# Patient Record
Sex: Male | Born: 1935 | Race: White | Hispanic: No | Marital: Married
Health system: Southern US, Community
[De-identification: ages and names within clinical notes are randomized; demographics above are authoritative.]

---

## 2004-06-16 ENCOUNTER — Ambulatory Visit: Payer: Self-pay | Admitting: Urology

## 2004-06-29 ENCOUNTER — Ambulatory Visit: Payer: Self-pay | Admitting: Cardiology

## 2004-07-13 ENCOUNTER — Ambulatory Visit: Payer: Self-pay | Admitting: Internal Medicine

## 2004-07-21 ENCOUNTER — Ambulatory Visit: Payer: Self-pay | Admitting: Internal Medicine

## 2005-06-16 ENCOUNTER — Ambulatory Visit: Payer: Self-pay | Admitting: Internal Medicine

## 2005-06-30 ENCOUNTER — Ambulatory Visit: Payer: Self-pay | Admitting: Internal Medicine

## 2006-02-22 ENCOUNTER — Ambulatory Visit: Payer: Self-pay | Admitting: Internal Medicine

## 2006-02-27 ENCOUNTER — Ambulatory Visit: Payer: Self-pay | Admitting: Internal Medicine

## 2007-03-31 ENCOUNTER — Ambulatory Visit: Payer: Self-pay | Admitting: Internal Medicine

## 2008-02-07 ENCOUNTER — Other Ambulatory Visit: Payer: Self-pay

## 2008-02-07 ENCOUNTER — Inpatient Hospital Stay: Payer: Self-pay | Admitting: Internal Medicine

## 2008-02-18 ENCOUNTER — Ambulatory Visit: Payer: Self-pay | Admitting: Internal Medicine

## 2009-10-02 ENCOUNTER — Ambulatory Visit: Payer: Self-pay | Admitting: Internal Medicine

## 2010-10-15 ENCOUNTER — Inpatient Hospital Stay: Payer: Self-pay | Admitting: Orthopedic Surgery

## 2010-10-22 ENCOUNTER — Encounter: Payer: Self-pay | Admitting: Internal Medicine

## 2010-10-29 ENCOUNTER — Encounter: Payer: Self-pay | Admitting: Internal Medicine

## 2010-11-28 ENCOUNTER — Encounter: Payer: Self-pay | Admitting: Internal Medicine

## 2010-12-29 ENCOUNTER — Encounter: Payer: Self-pay | Admitting: Internal Medicine

## 2011-04-12 ENCOUNTER — Inpatient Hospital Stay: Payer: Self-pay | Admitting: Internal Medicine

## 2011-04-18 LAB — PATHOLOGY REPORT

## 2011-05-10 ENCOUNTER — Other Ambulatory Visit: Payer: Self-pay | Admitting: Podiatry

## 2011-05-12 ENCOUNTER — Inpatient Hospital Stay: Payer: Self-pay | Admitting: Podiatry

## 2011-06-27 ENCOUNTER — Other Ambulatory Visit: Payer: Self-pay | Admitting: Podiatry

## 2011-08-01 ENCOUNTER — Other Ambulatory Visit: Payer: Self-pay | Admitting: Podiatry

## 2011-08-05 LAB — WOUND CULTURE

## 2011-11-22 ENCOUNTER — Other Ambulatory Visit: Payer: Self-pay | Admitting: Podiatry

## 2011-11-30 ENCOUNTER — Ambulatory Visit: Payer: Self-pay | Admitting: Podiatry

## 2011-11-30 LAB — BASIC METABOLIC PANEL
BUN: 29 mg/dL — ABNORMAL HIGH (ref 7–18)
Chloride: 104 mmol/L (ref 98–107)
Co2: 29 mmol/L (ref 21–32)
EGFR (Non-African Amer.): 31 — ABNORMAL LOW
Glucose: 159 mg/dL — ABNORMAL HIGH (ref 65–99)
Osmolality: 289 (ref 275–301)
Potassium: 3.9 mmol/L (ref 3.5–5.1)
Sodium: 140 mmol/L (ref 136–145)

## 2011-11-30 LAB — PROTIME-INR
INR: 2.3
Prothrombin Time: 25.4 secs — ABNORMAL HIGH (ref 11.5–14.7)

## 2011-11-30 LAB — HEMOGLOBIN: HGB: 14.9 g/dL (ref 13.0–18.0)

## 2011-12-02 ENCOUNTER — Ambulatory Visit: Payer: Self-pay | Admitting: Podiatry

## 2012-01-12 ENCOUNTER — Ambulatory Visit: Payer: Self-pay | Admitting: Podiatry

## 2012-03-07 ENCOUNTER — Inpatient Hospital Stay: Payer: Self-pay | Admitting: Surgery

## 2012-03-07 LAB — URINALYSIS, COMPLETE
Bilirubin,UR: NEGATIVE
Leukocyte Esterase: NEGATIVE
Nitrite: NEGATIVE
Ph: 5 (ref 4.5–8.0)
Protein: 100
RBC,UR: 5 /HPF (ref 0–5)
Specific Gravity: 1.02 (ref 1.003–1.030)
Squamous Epithelial: <1

## 2012-03-07 LAB — CBC
HCT: 42.6 % (ref 40.0–52.0)
HGB: 14.6 g/dL (ref 13.0–18.0)
RDW: 16.1 % — ABNORMAL HIGH (ref 11.5–14.5)
WBC: 16.1 10*3/uL — ABNORMAL HIGH (ref 3.8–10.6)

## 2012-03-07 LAB — COMPREHENSIVE METABOLIC PANEL
Albumin: 3.6 g/dL (ref 3.4–5.0)
Alkaline Phosphatase: 167 U/L — ABNORMAL HIGH (ref 50–136)
Calcium, Total: 8.4 mg/dL — ABNORMAL LOW (ref 8.5–10.1)
Co2: 24 mmol/L (ref 21–32)
EGFR (African American): 39 — ABNORMAL LOW
SGOT(AST): 245 U/L — ABNORMAL HIGH (ref 15–37)
SGPT (ALT): 128 U/L — ABNORMAL HIGH (ref 12–78)
Total Protein: 7.2 g/dL (ref 6.4–8.2)

## 2012-03-07 LAB — LIPASE, BLOOD: Lipase: 74 U/L (ref 73–393)

## 2012-03-07 LAB — TROPONIN I: Troponin-I: 0.03 ng/mL

## 2012-03-07 LAB — PROTIME-INR
INR: 3.9
Prothrombin Time: 38.1 secs — ABNORMAL HIGH (ref 11.5–14.7)

## 2012-03-08 DIAGNOSIS — I4891 Unspecified atrial fibrillation: Secondary | ICD-10-CM

## 2012-03-08 LAB — BASIC METABOLIC PANEL
BUN: 26 mg/dL — ABNORMAL HIGH (ref 7–18)
Creatinine: 1.93 mg/dL — ABNORMAL HIGH (ref 0.60–1.30)
EGFR (Non-African Amer.): 32 — ABNORMAL LOW
Glucose: 152 mg/dL — ABNORMAL HIGH (ref 65–99)
Osmolality: 289 (ref 275–301)
Potassium: 4.2 mmol/L (ref 3.5–5.1)
Sodium: 141 mmol/L (ref 136–145)

## 2012-03-08 LAB — HEPATIC FUNCTION PANEL A (ARMC)
Bilirubin, Direct: 1.2 mg/dL — ABNORMAL HIGH (ref 0.00–0.20)
Bilirubin,Total: 3.5 mg/dL — ABNORMAL HIGH (ref 0.2–1.0)
SGOT(AST): 151 U/L — ABNORMAL HIGH (ref 15–37)
Total Protein: 6.4 g/dL (ref 6.4–8.2)

## 2012-03-08 LAB — CBC WITH DIFFERENTIAL/PLATELET
Basophil #: 0 10*3/uL (ref 0.0–0.1)
Eosinophil #: 0 10*3/uL (ref 0.0–0.7)
HCT: 38.9 % — ABNORMAL LOW (ref 40.0–52.0)
Lymphocyte #: 1.1 10*3/uL (ref 1.0–3.6)
Lymphocyte %: 6 %
MCH: 30.1 pg (ref 26.0–34.0)
MCHC: 35 g/dL (ref 32.0–36.0)
MCV: 86 fL (ref 80–100)
Neutrophil #: 15.8 10*3/uL — ABNORMAL HIGH (ref 1.4–6.5)
Platelet: 111 10*3/uL — ABNORMAL LOW (ref 150–440)
RDW: 15.7 % — ABNORMAL HIGH (ref 11.5–14.5)

## 2012-03-09 LAB — CBC WITH DIFFERENTIAL/PLATELET
Basophil #: 0 10*3/uL (ref 0.0–0.1)
Basophil %: 0.3 %
Eosinophil %: 0.9 %
HGB: 12.6 g/dL — ABNORMAL LOW (ref 13.0–18.0)
Lymphocyte #: 1.2 10*3/uL (ref 1.0–3.6)
MCH: 30.6 pg (ref 26.0–34.0)
MCHC: 35.8 g/dL (ref 32.0–36.0)
MCV: 86 fL (ref 80–100)
Monocyte #: 1.1 x10 3/mm — ABNORMAL HIGH (ref 0.2–1.0)
Monocyte %: 7.5 %
Neutrophil %: 82.8 %
Platelet: 102 10*3/uL — ABNORMAL LOW (ref 150–440)
RBC: 4.11 10*6/uL — ABNORMAL LOW (ref 4.40–5.90)
WBC: 14.4 10*3/uL — ABNORMAL HIGH (ref 3.8–10.6)

## 2012-03-09 LAB — PROTIME-INR
INR: 2.9
Prothrombin Time: 30.2 secs — ABNORMAL HIGH (ref 11.5–14.7)

## 2012-03-09 LAB — BASIC METABOLIC PANEL
Anion Gap: 12 (ref 7–16)
Calcium, Total: 7.8 mg/dL — ABNORMAL LOW (ref 8.5–10.1)
Creatinine: 2.22 mg/dL — ABNORMAL HIGH (ref 0.60–1.30)
EGFR (African American): 31 — ABNORMAL LOW
Glucose: 111 mg/dL — ABNORMAL HIGH (ref 65–99)
Osmolality: 282 (ref 275–301)
Sodium: 137 mmol/L (ref 136–145)

## 2012-03-09 LAB — HEPATIC FUNCTION PANEL A (ARMC)
Albumin: 2.8 g/dL — ABNORMAL LOW (ref 3.4–5.0)
Alkaline Phosphatase: 103 U/L (ref 50–136)
SGOT(AST): 44 U/L — ABNORMAL HIGH (ref 15–37)

## 2012-03-10 LAB — CBC WITH DIFFERENTIAL/PLATELET
Basophil #: 0 10*3/uL (ref 0.0–0.1)
Basophil %: 0.5 %
Eosinophil #: 0.2 10*3/uL (ref 0.0–0.7)
HGB: 12.9 g/dL — ABNORMAL LOW (ref 13.0–18.0)
Lymphocyte %: 14.4 %
MCHC: 34.9 g/dL (ref 32.0–36.0)
Monocyte #: 0.6 x10 3/mm (ref 0.2–1.0)
Neutrophil #: 6.5 10*3/uL (ref 1.4–6.5)
Neutrophil %: 75.8 %
Platelet: 106 10*3/uL — ABNORMAL LOW (ref 150–440)
RDW: 15.8 % — ABNORMAL HIGH (ref 11.5–14.5)
WBC: 8.5 10*3/uL (ref 3.8–10.6)

## 2012-03-10 LAB — BASIC METABOLIC PANEL
Anion Gap: 10 (ref 7–16)
Calcium, Total: 7.9 mg/dL — ABNORMAL LOW (ref 8.5–10.1)
Chloride: 106 mmol/L (ref 98–107)
Co2: 24 mmol/L (ref 21–32)
Creatinine: 2.01 mg/dL — ABNORMAL HIGH (ref 0.60–1.30)
EGFR (Non-African Amer.): 30 — ABNORMAL LOW
Potassium: 3.8 mmol/L (ref 3.5–5.1)
Sodium: 140 mmol/L (ref 136–145)

## 2012-03-10 LAB — HEPATIC FUNCTION PANEL A (ARMC)
Albumin: 2.8 g/dL — ABNORMAL LOW (ref 3.4–5.0)
Bilirubin,Total: 2.7 mg/dL — ABNORMAL HIGH (ref 0.2–1.0)
SGOT(AST): 22 U/L (ref 15–37)
SGPT (ALT): 56 U/L (ref 12–78)
Total Protein: 6.5 g/dL (ref 6.4–8.2)

## 2012-03-10 LAB — PROTIME-INR: INR: 1.4

## 2012-03-11 LAB — CBC WITH DIFFERENTIAL/PLATELET
Basophil #: 0 10*3/uL (ref 0.0–0.1)
Basophil #: 0 10*3/uL (ref 0.0–0.1)
Eosinophil #: 0.1 10*3/uL (ref 0.0–0.7)
Eosinophil %: 0.4 %
HCT: 36.1 % — ABNORMAL LOW (ref 40.0–52.0)
Lymphocyte #: 1.1 10*3/uL (ref 1.0–3.6)
Lymphocyte #: 0.6 10*3/uL — ABNORMAL LOW (ref 1.0–3.6)
Lymphocyte %: 18.7 %
Lymphocyte %: 5.9 %
MCH: 29.4 pg (ref 26.0–34.0)
MCH: 30 pg (ref 26.0–34.0)
MCHC: 34.2 g/dL (ref 32.0–36.0)
MCHC: 34.3 g/dL (ref 32.0–36.0)
MCV: 86 fL (ref 80–100)
MCV: 87 fL (ref 80–100)
Monocyte #: 0.8 x10 3/mm (ref 0.2–1.0)
Monocyte %: 9.7 %
Neutrophil #: 4.2 10*3/uL (ref 1.4–6.5)
Platelet: 119 10*3/uL — ABNORMAL LOW (ref 150–440)
RDW: 15.8 % — ABNORMAL HIGH (ref 11.5–14.5)
RDW: 15.5 % — ABNORMAL HIGH (ref 11.5–14.5)

## 2012-03-11 LAB — BASIC METABOLIC PANEL
Anion Gap: 9 (ref 7–16)
BUN: 21 mg/dL — ABNORMAL HIGH (ref 7–18)
Chloride: 109 mmol/L — ABNORMAL HIGH (ref 98–107)
Co2: 23 mmol/L (ref 21–32)
Creatinine: 1.73 mg/dL — ABNORMAL HIGH (ref 0.60–1.30)
Osmolality: 285 (ref 275–301)
Potassium: 3.8 mmol/L (ref 3.5–5.1)

## 2012-03-12 LAB — CBC WITH DIFFERENTIAL/PLATELET
Basophil #: 0 10*3/uL (ref 0.0–0.1)
HCT: 36.1 % — ABNORMAL LOW (ref 40.0–52.0)
Lymphocyte #: 0.8 10*3/uL — ABNORMAL LOW (ref 1.0–3.6)
Lymphocyte %: 8 %
MCHC: 34.5 g/dL (ref 32.0–36.0)
MCV: 88 fL (ref 80–100)
Monocyte %: 9.2 %
Neutrophil %: 82.1 %
Platelet: 123 10*3/uL — ABNORMAL LOW (ref 150–440)
RDW: 15.7 % — ABNORMAL HIGH (ref 11.5–14.5)

## 2012-03-12 LAB — COMPREHENSIVE METABOLIC PANEL
Anion Gap: 9 (ref 7–16)
BUN: 20 mg/dL — ABNORMAL HIGH (ref 7–18)
Chloride: 105 mmol/L (ref 98–107)
Co2: 24 mmol/L (ref 21–32)
Creatinine: 1.87 mg/dL — ABNORMAL HIGH (ref 0.60–1.30)
Osmolality: 283 (ref 275–301)
Potassium: 4.1 mmol/L (ref 3.5–5.1)
SGPT (ALT): 37 U/L (ref 12–78)
Total Protein: 6.3 g/dL — ABNORMAL LOW (ref 6.4–8.2)

## 2012-03-12 LAB — PROTIME-INR
INR: 1
Prothrombin Time: 14 secs (ref 11.5–14.7)

## 2012-03-13 LAB — COMPREHENSIVE METABOLIC PANEL
Anion Gap: 9 (ref 7–16)
Calcium, Total: 7.9 mg/dL — ABNORMAL LOW (ref 8.5–10.1)
Co2: 23 mmol/L (ref 21–32)
Creatinine: 1.84 mg/dL — ABNORMAL HIGH (ref 0.60–1.30)
EGFR (African American): 39 — ABNORMAL LOW
Glucose: 151 mg/dL — ABNORMAL HIGH (ref 65–99)
Osmolality: 280 (ref 275–301)
SGOT(AST): 14 U/L — ABNORMAL LOW (ref 15–37)
SGPT (ALT): 22 U/L (ref 12–78)
Total Protein: 6.1 g/dL — ABNORMAL LOW (ref 6.4–8.2)

## 2012-03-13 LAB — PROTIME-INR: Prothrombin Time: 15 secs — ABNORMAL HIGH (ref 11.5–14.7)

## 2012-03-14 LAB — HEPATIC FUNCTION PANEL A (ARMC)
Bilirubin,Total: 2 mg/dL — ABNORMAL HIGH (ref 0.2–1.0)
SGOT(AST): 11 U/L — ABNORMAL LOW (ref 15–37)
SGPT (ALT): 18 U/L (ref 12–78)

## 2012-03-14 LAB — BASIC METABOLIC PANEL
Anion Gap: 9 (ref 7–16)
BUN: 17 mg/dL (ref 7–18)
Chloride: 105 mmol/L (ref 98–107)
Co2: 24 mmol/L (ref 21–32)
EGFR (African American): 46 — ABNORMAL LOW
EGFR (Non-African Amer.): 40 — ABNORMAL LOW
Glucose: 144 mg/dL — ABNORMAL HIGH (ref 65–99)
Osmolality: 280 (ref 275–301)
Sodium: 138 mmol/L (ref 136–145)

## 2012-03-14 LAB — PATHOLOGY REPORT

## 2012-03-15 LAB — CBC WITH DIFFERENTIAL/PLATELET
Basophil %: 0.6 %
Eosinophil #: 0.2 10*3/uL (ref 0.0–0.7)
Eosinophil %: 2.8 %
Lymphocyte #: 0.8 10*3/uL — ABNORMAL LOW (ref 1.0–3.6)
Lymphocyte %: 15 %
MCHC: 34.2 g/dL (ref 32.0–36.0)
Monocyte #: 0.5 x10 3/mm (ref 0.2–1.0)
Neutrophil #: 4 10*3/uL (ref 1.4–6.5)
Platelet: 156 10*3/uL (ref 150–440)
RDW: 15.5 % — ABNORMAL HIGH (ref 11.5–14.5)
WBC: 5.6 10*3/uL (ref 3.8–10.6)

## 2012-03-15 LAB — COMPREHENSIVE METABOLIC PANEL
Bilirubin,Total: 1.8 mg/dL — ABNORMAL HIGH (ref 0.2–1.0)
Calcium, Total: 8.3 mg/dL — ABNORMAL LOW (ref 8.5–10.1)
Chloride: 106 mmol/L (ref 98–107)
Co2: 25 mmol/L (ref 21–32)
Creatinine: 1.62 mg/dL — ABNORMAL HIGH (ref 0.60–1.30)
EGFR (African American): 46 — ABNORMAL LOW
EGFR (Non-African Amer.): 39 — ABNORMAL LOW
Glucose: 159 mg/dL — ABNORMAL HIGH (ref 65–99)
Osmolality: 282 (ref 275–301)
Potassium: 3.8 mmol/L (ref 3.5–5.1)
SGPT (ALT): 16 U/L (ref 12–78)
Sodium: 139 mmol/L (ref 136–145)

## 2012-03-16 ENCOUNTER — Encounter: Payer: Self-pay | Admitting: Internal Medicine

## 2012-03-20 LAB — PROTIME-INR
INR: 1.2
Prothrombin Time: 15.9 secs — ABNORMAL HIGH (ref 11.5–14.7)

## 2012-03-22 LAB — PROTIME-INR: Prothrombin Time: 17.5 secs — ABNORMAL HIGH (ref 11.5–14.7)

## 2012-03-27 LAB — PROTIME-INR: INR: 1.6

## 2013-01-08 ENCOUNTER — Emergency Department: Payer: Self-pay | Admitting: Emergency Medicine

## 2013-05-29 ENCOUNTER — Emergency Department: Payer: Self-pay | Admitting: Emergency Medicine

## 2013-05-29 LAB — APTT: Activated PTT: 45.1 secs — ABNORMAL HIGH (ref 23.6–35.9)

## 2013-05-29 LAB — CBC WITH DIFFERENTIAL/PLATELET
Basophil %: 0.5 %
Eosinophil %: 0 %
HGB: 15.8 g/dL (ref 13.0–18.0)
Lymphocyte %: 7.1 %
MCH: 28.6 pg (ref 26.0–34.0)
Monocyte #: 0.6 x10 3/mm (ref 0.2–1.0)
Neutrophil #: 9.7 10*3/uL — ABNORMAL HIGH (ref 1.4–6.5)
Platelet: 157 10*3/uL (ref 150–440)
RDW: 15.8 % — ABNORMAL HIGH (ref 11.5–14.5)
WBC: 11.1 10*3/uL — ABNORMAL HIGH (ref 3.8–10.6)

## 2013-05-29 LAB — BASIC METABOLIC PANEL
BUN: 30 mg/dL — ABNORMAL HIGH (ref 7–18)
Calcium, Total: 8.9 mg/dL (ref 8.5–10.1)
Chloride: 101 mmol/L (ref 98–107)
Co2: 22 mmol/L (ref 21–32)
Osmolality: 279 (ref 275–301)
Potassium: 4.1 mmol/L (ref 3.5–5.1)

## 2013-05-29 LAB — URINALYSIS, COMPLETE
Bacteria: NONE SEEN
Glucose,UR: 50 mg/dL (ref 0–75)
Hyaline Cast: 9
Leukocyte Esterase: NEGATIVE
Nitrite: NEGATIVE
RBC,UR: 5 /HPF (ref 0–5)
Specific Gravity: 1.015 (ref 1.003–1.030)

## 2013-05-29 LAB — PROTIME-INR: Prothrombin Time: 27.1 secs — ABNORMAL HIGH (ref 11.5–14.7)

## 2013-06-07 ENCOUNTER — Ambulatory Visit: Payer: Self-pay | Admitting: Orthopedic Surgery

## 2013-06-11 ENCOUNTER — Inpatient Hospital Stay: Payer: Self-pay | Admitting: Internal Medicine

## 2013-06-11 LAB — COMPREHENSIVE METABOLIC PANEL
ALBUMIN: 2.9 g/dL — AB (ref 3.4–5.0)
AST: 22 U/L (ref 15–37)
Alkaline Phosphatase: 181 U/L — ABNORMAL HIGH
Anion Gap: 6 — ABNORMAL LOW (ref 7–16)
BUN: 35 mg/dL — AB (ref 7–18)
Bilirubin,Total: 2.4 mg/dL — ABNORMAL HIGH (ref 0.2–1.0)
CHLORIDE: 96 mmol/L — AB (ref 98–107)
Calcium, Total: 8.8 mg/dL (ref 8.5–10.1)
Co2: 30 mmol/L (ref 21–32)
Creatinine: 1.65 mg/dL — ABNORMAL HIGH (ref 0.60–1.30)
EGFR (African American): 44 — ABNORMAL LOW
EGFR (Non-African Amer.): 38 — ABNORMAL LOW
Glucose: 231 mg/dL — ABNORMAL HIGH (ref 65–99)
Osmolality: 280 (ref 275–301)
Potassium: 3.7 mmol/L (ref 3.5–5.1)
SGPT (ALT): 20 U/L (ref 12–78)
SODIUM: 132 mmol/L — AB (ref 136–145)
Total Protein: 6.8 g/dL (ref 6.4–8.2)

## 2013-06-11 LAB — URINALYSIS, COMPLETE
BILIRUBIN, UR: NEGATIVE
Glucose,UR: 50 mg/dL (ref 0–75)
Hyaline Cast: 6
Ketone: NEGATIVE
LEUKOCYTE ESTERASE: NEGATIVE
Nitrite: NEGATIVE
Ph: 5 (ref 4.5–8.0)
Specific Gravity: 1.018 (ref 1.003–1.030)
Squamous Epithelial: 1
WBC UR: 1 /HPF (ref 0–5)

## 2013-06-11 LAB — DRUG SCREEN, URINE
AMPHETAMINES, UR SCREEN: NEGATIVE (ref ?–1000)
BENZODIAZEPINE, UR SCRN: NEGATIVE (ref ?–200)
Barbiturates, Ur Screen: NEGATIVE (ref ?–200)
COCAINE METABOLITE, UR ~~LOC~~: NEGATIVE (ref ?–300)
Cannabinoid 50 Ng, Ur ~~LOC~~: NEGATIVE (ref ?–50)
MDMA (Ecstasy)Ur Screen: NEGATIVE (ref ?–500)
Methadone, Ur Screen: NEGATIVE (ref ?–300)
Opiate, Ur Screen: NEGATIVE (ref ?–300)
Phencyclidine (PCP) Ur S: NEGATIVE (ref ?–25)
TRICYCLIC, UR SCREEN: NEGATIVE (ref ?–1000)

## 2013-06-11 LAB — PROTIME-INR
INR: 2.1
PROTHROMBIN TIME: 23.4 s — AB (ref 11.5–14.7)

## 2013-06-11 LAB — CBC
HCT: 43.9 % (ref 40.0–52.0)
HGB: 14.5 g/dL (ref 13.0–18.0)
MCH: 27.8 pg (ref 26.0–34.0)
MCHC: 33 g/dL (ref 32.0–36.0)
MCV: 84 fL (ref 80–100)
PLATELETS: 229 10*3/uL (ref 150–440)
RBC: 5.21 10*6/uL (ref 4.40–5.90)
RDW: 16 % — ABNORMAL HIGH (ref 11.5–14.5)
WBC: 13.5 10*3/uL — AB (ref 3.8–10.6)

## 2013-06-11 LAB — TSH
THYROID STIMULATING HORM: 2.13 u[IU]/mL
Thyroid Stimulating Horm: 2.64 u[IU]/mL

## 2013-06-11 LAB — ETHANOL
ETHANOL LVL: 3 mg/dL
Ethanol %: 0.003 % (ref 0.000–0.080)

## 2013-06-11 LAB — TROPONIN I: Troponin-I: 0.04 ng/mL

## 2013-06-11 LAB — SEDIMENTATION RATE: Erythrocyte Sed Rate: 11 mm/hr (ref 0–20)

## 2013-06-11 LAB — LIPASE, BLOOD: Lipase: 106 U/L (ref 73–393)

## 2013-06-11 LAB — CK: CK, Total: 132 U/L (ref 35–232)

## 2013-06-12 LAB — BASIC METABOLIC PANEL
Anion Gap: 9 (ref 7–16)
BUN: 31 mg/dL — ABNORMAL HIGH (ref 7–18)
CALCIUM: 8.5 mg/dL (ref 8.5–10.1)
CHLORIDE: 98 mmol/L (ref 98–107)
CO2: 23 mmol/L (ref 21–32)
Creatinine: 1.36 mg/dL — ABNORMAL HIGH (ref 0.60–1.30)
EGFR (African American): 56 — ABNORMAL LOW
EGFR (Non-African Amer.): 48 — ABNORMAL LOW
GLUCOSE: 187 mg/dL — AB (ref 65–99)
OSMOLALITY: 272 (ref 275–301)
POTASSIUM: 3.7 mmol/L (ref 3.5–5.1)
SODIUM: 130 mmol/L — AB (ref 136–145)

## 2013-06-12 LAB — TROPONIN I
Troponin-I: 0.04 ng/mL
Troponin-I: 0.04 ng/mL

## 2013-06-12 LAB — CBC WITH DIFFERENTIAL/PLATELET
BASOS ABS: 0.1 10*3/uL (ref 0.0–0.1)
Basophil %: 0.4 %
Eosinophil #: 0 10*3/uL (ref 0.0–0.7)
Eosinophil %: 0.2 %
HCT: 42.6 % (ref 40.0–52.0)
HGB: 14.1 g/dL (ref 13.0–18.0)
LYMPHS PCT: 6.6 %
Lymphocyte #: 0.9 10*3/uL — ABNORMAL LOW (ref 1.0–3.6)
MCH: 27.8 pg (ref 26.0–34.0)
MCHC: 33.2 g/dL (ref 32.0–36.0)
MCV: 84 fL (ref 80–100)
Monocyte #: 1.3 x10 3/mm — ABNORMAL HIGH (ref 0.2–1.0)
Monocyte %: 9.1 %
NEUTROS PCT: 83.7 %
Neutrophil #: 11.5 10*3/uL — ABNORMAL HIGH (ref 1.4–6.5)
PLATELETS: 206 10*3/uL (ref 150–440)
RBC: 5.1 10*6/uL (ref 4.40–5.90)
RDW: 16.4 % — ABNORMAL HIGH (ref 11.5–14.5)
WBC: 13.7 10*3/uL — ABNORMAL HIGH (ref 3.8–10.6)

## 2013-06-12 LAB — HEPATIC FUNCTION PANEL A (ARMC)
Albumin: 2.7 g/dL — ABNORMAL LOW (ref 3.4–5.0)
Alkaline Phosphatase: 165 U/L — ABNORMAL HIGH
Bilirubin, Direct: 1.3 mg/dL — ABNORMAL HIGH (ref 0.00–0.20)
Bilirubin,Total: 2 mg/dL — ABNORMAL HIGH (ref 0.2–1.0)
SGOT(AST): 26 U/L (ref 15–37)
SGPT (ALT): 23 U/L (ref 12–78)
Total Protein: 6.6 g/dL (ref 6.4–8.2)

## 2013-06-12 LAB — PROTIME-INR
INR: 2.2
PROTHROMBIN TIME: 23.5 s — AB (ref 11.5–14.7)

## 2013-06-13 LAB — CBC WITH DIFFERENTIAL/PLATELET
BASOS ABS: 0 10*3/uL (ref 0.0–0.1)
BASOS PCT: 0.3 %
EOS ABS: 0.1 10*3/uL (ref 0.0–0.7)
EOS PCT: 0.5 %
HCT: 42.1 % (ref 40.0–52.0)
HGB: 14.1 g/dL (ref 13.0–18.0)
Lymphocyte #: 1.3 10*3/uL (ref 1.0–3.6)
Lymphocyte %: 10.4 %
MCH: 28.4 pg (ref 26.0–34.0)
MCHC: 33.4 g/dL (ref 32.0–36.0)
MCV: 85 fL (ref 80–100)
Monocyte #: 0.9 x10 3/mm (ref 0.2–1.0)
Monocyte %: 7.4 %
NEUTROS PCT: 81.4 %
Neutrophil #: 10 10*3/uL — ABNORMAL HIGH (ref 1.4–6.5)
Platelet: 191 10*3/uL (ref 150–440)
RBC: 4.95 10*6/uL (ref 4.40–5.90)
RDW: 16.2 % — ABNORMAL HIGH (ref 11.5–14.5)
WBC: 12.3 10*3/uL — ABNORMAL HIGH (ref 3.8–10.6)

## 2013-06-13 LAB — BASIC METABOLIC PANEL
ANION GAP: 7 (ref 7–16)
BUN: 25 mg/dL — ABNORMAL HIGH (ref 7–18)
CO2: 24 mmol/L (ref 21–32)
Calcium, Total: 8.6 mg/dL (ref 8.5–10.1)
Chloride: 100 mmol/L (ref 98–107)
Creatinine: 1.23 mg/dL (ref 0.60–1.30)
GFR CALC NON AF AMER: 55 — AB
Glucose: 155 mg/dL — ABNORMAL HIGH (ref 65–99)
Osmolality: 270 (ref 275–301)
POTASSIUM: 3.7 mmol/L (ref 3.5–5.1)
Sodium: 131 mmol/L — ABNORMAL LOW (ref 136–145)

## 2013-06-13 LAB — URINE CULTURE

## 2013-06-14 LAB — BASIC METABOLIC PANEL WITH GFR
Anion Gap: 6 — ABNORMAL LOW
BUN: 27 mg/dL — ABNORMAL HIGH
Calcium, Total: 8.7 mg/dL
Chloride: 102 mmol/L
Co2: 25 mmol/L
Creatinine: 1.34 mg/dL — ABNORMAL HIGH
EGFR (African American): 57 — ABNORMAL LOW
EGFR (Non-African Amer.): 49 — ABNORMAL LOW
Glucose: 138 mg/dL — ABNORMAL HIGH
Osmolality: 274
Potassium: 3.9 mmol/L
Sodium: 133 mmol/L — ABNORMAL LOW

## 2013-06-14 LAB — CBC WITH DIFFERENTIAL/PLATELET
Basophil #: 0.1 x10 3/mm 3
Basophil %: 0.5 %
Eosinophil #: 0.1 x10 3/mm 3
Eosinophil %: 0.6 %
HCT: 40.2 %
HGB: 13.4 g/dL
Lymphocyte %: 10.2 %
Lymphs Abs: 1.1 x10 3/mm 3
MCH: 28 pg
MCHC: 33.3 g/dL
MCV: 84 fL
Monocyte #: 0.7 "x10 3/mm "
Monocyte %: 6.7 %
Neutrophil #: 8.9 x10 3/mm 3 — ABNORMAL HIGH
Neutrophil %: 82 %
Platelet: 183 x10 3/mm 3
RBC: 4.77 x10 6/mm 3
RDW: 16.2 % — ABNORMAL HIGH
WBC: 10.9 x10 3/mm 3 — ABNORMAL HIGH

## 2013-06-15 LAB — BASIC METABOLIC PANEL
Anion Gap: 6 — ABNORMAL LOW (ref 7–16)
BUN: 28 mg/dL — AB (ref 7–18)
CHLORIDE: 102 mmol/L (ref 98–107)
Calcium, Total: 8.7 mg/dL (ref 8.5–10.1)
Co2: 26 mmol/L (ref 21–32)
Creatinine: 1.28 mg/dL (ref 0.60–1.30)
EGFR (African American): >60
EGFR (Non-African Amer.): 52 — ABNORMAL LOW
Glucose: 168 mg/dL — ABNORMAL HIGH (ref 65–99)
Osmolality: 278 (ref 275–301)
Potassium: 4.1 mmol/L (ref 3.5–5.1)
Sodium: 134 mmol/L — ABNORMAL LOW (ref 136–145)

## 2013-06-15 LAB — CBC WITH DIFFERENTIAL/PLATELET
BASOS PCT: 0.7 %
Basophil #: 0.1 10*3/uL (ref 0.0–0.1)
EOS ABS: 0.1 10*3/uL (ref 0.0–0.7)
Eosinophil %: 0.8 %
HCT: 42.2 % (ref 40.0–52.0)
HGB: 13.8 g/dL (ref 13.0–18.0)
LYMPHS PCT: 9.1 %
Lymphocyte #: 1 10*3/uL (ref 1.0–3.6)
MCH: 27.5 pg (ref 26.0–34.0)
MCHC: 32.8 g/dL (ref 32.0–36.0)
MCV: 84 fL (ref 80–100)
MONO ABS: 0.7 x10 3/mm (ref 0.2–1.0)
MONOS PCT: 6.3 %
NEUTROS PCT: 83.1 %
Neutrophil #: 9.5 10*3/uL — ABNORMAL HIGH (ref 1.4–6.5)
Platelet: 193 10*3/uL (ref 150–440)
RBC: 5.03 10*6/uL (ref 4.40–5.90)
RDW: 16.5 % — ABNORMAL HIGH (ref 11.5–14.5)
WBC: 11.4 10*3/uL — ABNORMAL HIGH (ref 3.8–10.6)

## 2013-06-16 LAB — CBC WITH DIFFERENTIAL/PLATELET
BASOS ABS: 0.1 10*3/uL (ref 0.0–0.1)
BASOS PCT: 0.5 %
Eosinophil #: 0.1 10*3/uL (ref 0.0–0.7)
Eosinophil %: 0.5 %
HCT: 42.4 % (ref 40.0–52.0)
HGB: 14 g/dL (ref 13.0–18.0)
Lymphocyte #: 0.9 10*3/uL — ABNORMAL LOW (ref 1.0–3.6)
Lymphocyte %: 7.4 %
MCH: 27.6 pg (ref 26.0–34.0)
MCHC: 32.9 g/dL (ref 32.0–36.0)
MCV: 84 fL (ref 80–100)
MONOS PCT: 7.3 %
Monocyte #: 0.9 x10 3/mm (ref 0.2–1.0)
NEUTROS ABS: 10 10*3/uL — AB (ref 1.4–6.5)
Neutrophil %: 84.3 %
Platelet: 214 10*3/uL (ref 150–440)
RBC: 5.07 10*6/uL (ref 4.40–5.90)
RDW: 16 % — ABNORMAL HIGH (ref 11.5–14.5)
WBC: 11.8 10*3/uL — ABNORMAL HIGH (ref 3.8–10.6)

## 2013-06-16 LAB — BASIC METABOLIC PANEL
Anion Gap: 6 — ABNORMAL LOW (ref 7–16)
BUN: 25 mg/dL — AB (ref 7–18)
CALCIUM: 8.8 mg/dL (ref 8.5–10.1)
Chloride: 99 mmol/L (ref 98–107)
Co2: 26 mmol/L (ref 21–32)
Creatinine: 1.31 mg/dL — ABNORMAL HIGH (ref 0.60–1.30)
EGFR (Non-African Amer.): 51 — ABNORMAL LOW
GFR CALC AF AMER: 59 — AB
Glucose: 169 mg/dL — ABNORMAL HIGH (ref 65–99)
Osmolality: 271 (ref 275–301)
Potassium: 4 mmol/L (ref 3.5–5.1)
SODIUM: 131 mmol/L — AB (ref 136–145)

## 2013-06-16 LAB — CULTURE, BLOOD (SINGLE)

## 2013-06-17 LAB — CBC WITH DIFFERENTIAL/PLATELET
Basophil #: 0.1 10*3/uL (ref 0.0–0.1)
Basophil %: 0.6 %
Eosinophil #: 0.1 10*3/uL (ref 0.0–0.7)
Eosinophil %: 1.5 %
HCT: 39.2 % — AB (ref 40.0–52.0)
HGB: 13.1 g/dL (ref 13.0–18.0)
LYMPHS ABS: 1.1 10*3/uL (ref 1.0–3.6)
Lymphocyte %: 11.1 %
MCH: 28.7 pg (ref 26.0–34.0)
MCHC: 33.4 g/dL (ref 32.0–36.0)
MCV: 86 fL (ref 80–100)
MONO ABS: 0.7 x10 3/mm (ref 0.2–1.0)
Monocyte %: 7.3 %
Neutrophil #: 7.9 10*3/uL — ABNORMAL HIGH (ref 1.4–6.5)
Neutrophil %: 79.5 %
Platelet: 185 10*3/uL (ref 150–440)
RBC: 4.56 10*6/uL (ref 4.40–5.90)
RDW: 16.2 % — ABNORMAL HIGH (ref 11.5–14.5)
WBC: 9.9 10*3/uL (ref 3.8–10.6)

## 2013-06-17 LAB — BASIC METABOLIC PANEL
ANION GAP: 5 — AB (ref 7–16)
BUN: 25 mg/dL — ABNORMAL HIGH (ref 7–18)
Calcium, Total: 8.7 mg/dL (ref 8.5–10.1)
Chloride: 99 mmol/L (ref 98–107)
Co2: 28 mmol/L (ref 21–32)
Creatinine: 1.27 mg/dL (ref 0.60–1.30)
EGFR (Non-African Amer.): 53 — ABNORMAL LOW
Glucose: 157 mg/dL — ABNORMAL HIGH (ref 65–99)
OSMOLALITY: 272 (ref 275–301)
POTASSIUM: 3.9 mmol/L (ref 3.5–5.1)
SODIUM: 132 mmol/L — AB (ref 136–145)

## 2013-06-18 LAB — BASIC METABOLIC PANEL
Anion Gap: 6 — ABNORMAL LOW (ref 7–16)
BUN: 24 mg/dL — ABNORMAL HIGH (ref 7–18)
CO2: 24 mmol/L (ref 21–32)
CREATININE: 1.3 mg/dL (ref 0.60–1.30)
Calcium, Total: 8.6 mg/dL (ref 8.5–10.1)
Chloride: 100 mmol/L (ref 98–107)
EGFR (African American): 59 — ABNORMAL LOW
EGFR (Non-African Amer.): 51 — ABNORMAL LOW
Glucose: 145 mg/dL — ABNORMAL HIGH (ref 65–99)
Osmolality: 267 (ref 275–301)
POTASSIUM: 4.5 mmol/L (ref 3.5–5.1)
Sodium: 130 mmol/L — ABNORMAL LOW (ref 136–145)

## 2013-06-18 LAB — CBC WITH DIFFERENTIAL/PLATELET
BASOS ABS: 0 10*3/uL (ref 0.0–0.1)
BASOS PCT: 0.3 %
EOS ABS: 0.1 10*3/uL (ref 0.0–0.7)
EOS PCT: 1.3 %
HCT: 38.9 % — AB (ref 40.0–52.0)
HGB: 12.8 g/dL — AB (ref 13.0–18.0)
LYMPHS ABS: 1 10*3/uL (ref 1.0–3.6)
Lymphocyte %: 11 %
MCH: 28.3 pg (ref 26.0–34.0)
MCHC: 33 g/dL (ref 32.0–36.0)
MCV: 86 fL (ref 80–100)
Monocyte #: 0.7 x10 3/mm (ref 0.2–1.0)
Monocyte %: 7.8 %
NEUTROS PCT: 79.6 %
Neutrophil #: 7.5 10*3/uL — ABNORMAL HIGH (ref 1.4–6.5)
PLATELETS: 187 10*3/uL (ref 150–440)
RBC: 4.54 10*6/uL (ref 4.40–5.90)
RDW: 16.5 % — ABNORMAL HIGH (ref 11.5–14.5)
WBC: 9.4 10*3/uL (ref 3.8–10.6)

## 2013-06-19 LAB — BASIC METABOLIC PANEL
Anion Gap: 9 (ref 7–16)
BUN: 23 mg/dL — AB (ref 7–18)
CALCIUM: 8.4 mg/dL — AB (ref 8.5–10.1)
CO2: 24 mmol/L (ref 21–32)
CREATININE: 1.17 mg/dL (ref 0.60–1.30)
Chloride: 101 mmol/L (ref 98–107)
EGFR (African American): >60
EGFR (Non-African Amer.): 58 — ABNORMAL LOW
GLUCOSE: 143 mg/dL — AB (ref 65–99)
Osmolality: 274 (ref 275–301)
Potassium: 4.5 mmol/L (ref 3.5–5.1)
Sodium: 134 mmol/L — ABNORMAL LOW (ref 136–145)

## 2013-06-19 LAB — CBC WITH DIFFERENTIAL/PLATELET
BASOS PCT: 0.5 %
Basophil #: 0 10*3/uL (ref 0.0–0.1)
Eosinophil #: 0.2 10*3/uL (ref 0.0–0.7)
Eosinophil %: 2 %
HCT: 39.1 % — ABNORMAL LOW (ref 40.0–52.0)
HGB: 12.9 g/dL — ABNORMAL LOW (ref 13.0–18.0)
LYMPHS PCT: 13 %
Lymphocyte #: 1 10*3/uL (ref 1.0–3.6)
MCH: 28.2 pg (ref 26.0–34.0)
MCHC: 33 g/dL (ref 32.0–36.0)
MCV: 86 fL (ref 80–100)
MONOS PCT: 8 %
Monocyte #: 0.6 x10 3/mm (ref 0.2–1.0)
NEUTROS PCT: 76.5 %
Neutrophil #: 5.8 10*3/uL (ref 1.4–6.5)
Platelet: 173 10*3/uL (ref 150–440)
RBC: 4.57 10*6/uL (ref 4.40–5.90)
RDW: 16.5 % — AB (ref 11.5–14.5)
WBC: 7.6 10*3/uL (ref 3.8–10.6)

## 2014-01-14 IMAGING — CT CT HEAD WITHOUT CONTRAST
2 series · 14 of 30 positions shown, 16 images · non-contrast
Comparison: 03/07/2012

CLINICAL DATA: Fall.  Anti coagulation.

EXAM:
CT HEAD WITHOUT CONTRAST
TECHNIQUE: Contiguous axial images were obtained from the base of the skull
through the vertex without intravenous contrast.

[Series 2: head wo · axial · 0.44mm/px · z∈[+272,+382]mm · 6 of 32 slices shown, 8 images]
[im 5/32  brain]
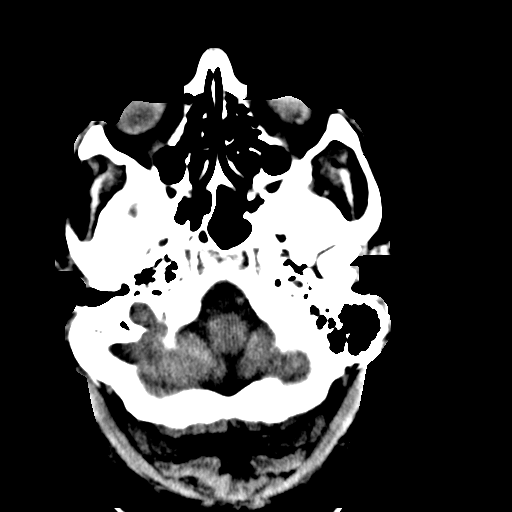
[im 5/32  bone]
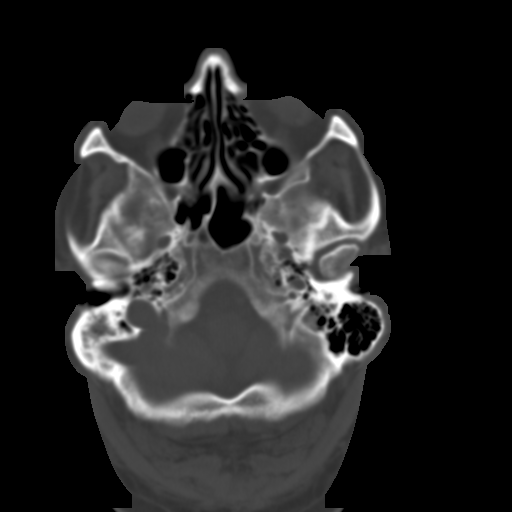
[im 9/32  brain]
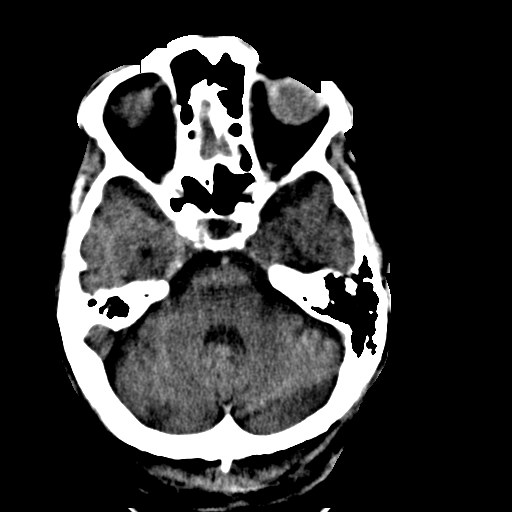
[im 14/32  brain]
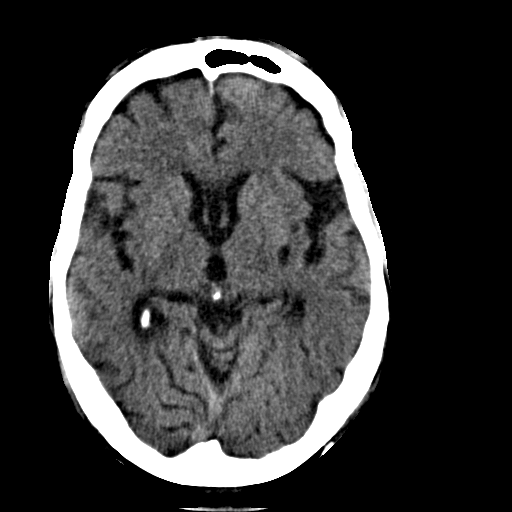
[im 18/32  brain]
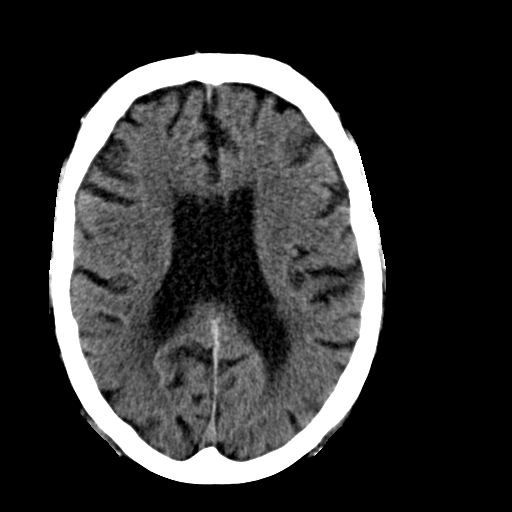
[im 23/32  brain]
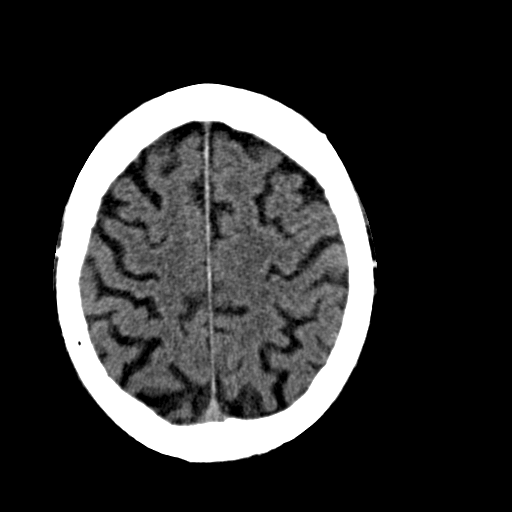
[im 23/32  bone]
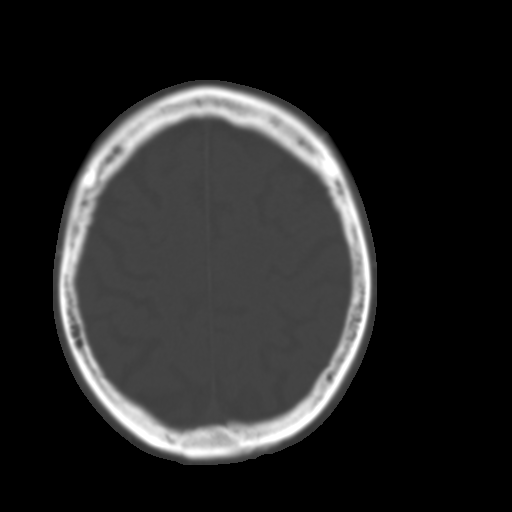
[im 27/32  brain]
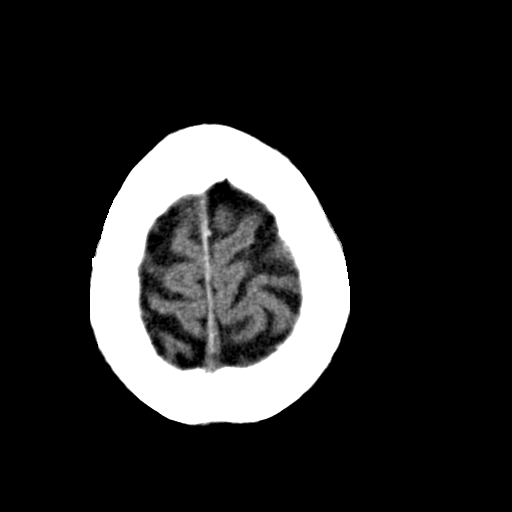

[Series 3: head bone · axial · 0.44mm/px · z∈[+261,+397]mm · 8 of 85 slices shown]
[im 9/85  bone]
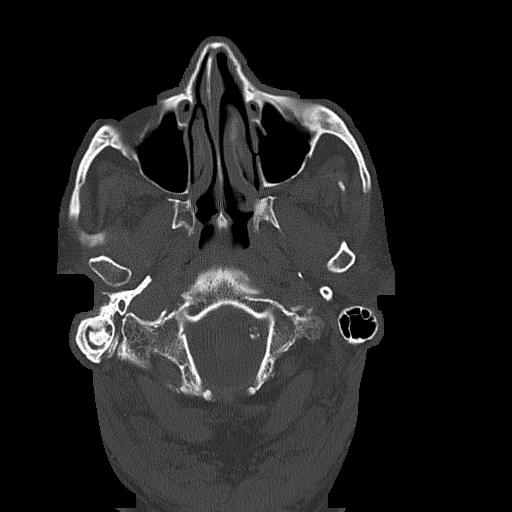
[im 17/85  bone]
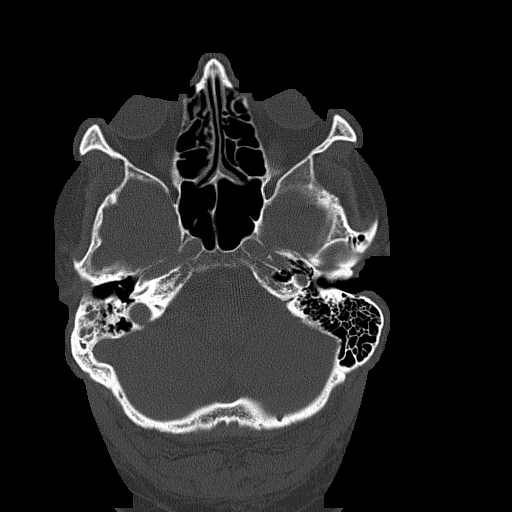
[im 29/85  bone]
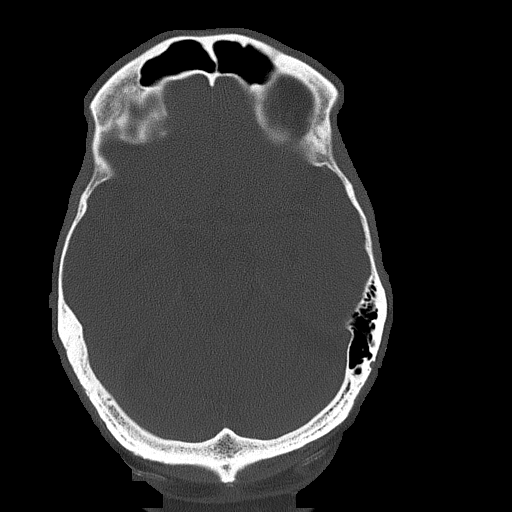
[im 37/85  bone]
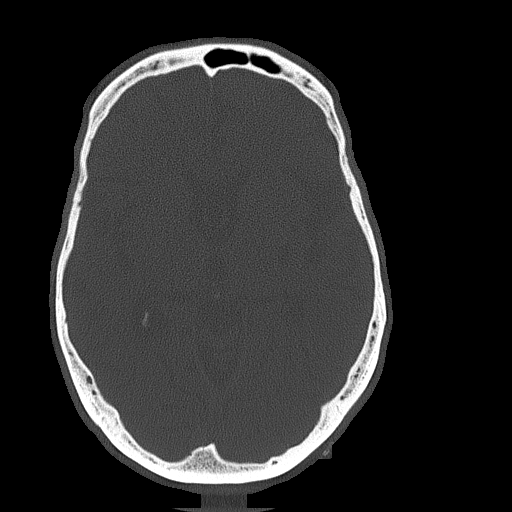
[im 49/85  bone]
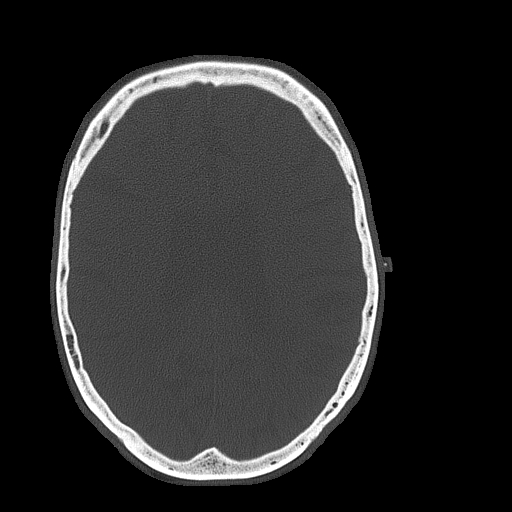
[im 57/85  bone]
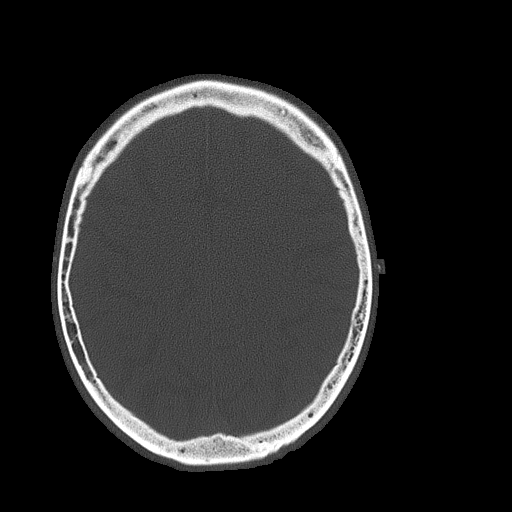
[im 69/85  bone]
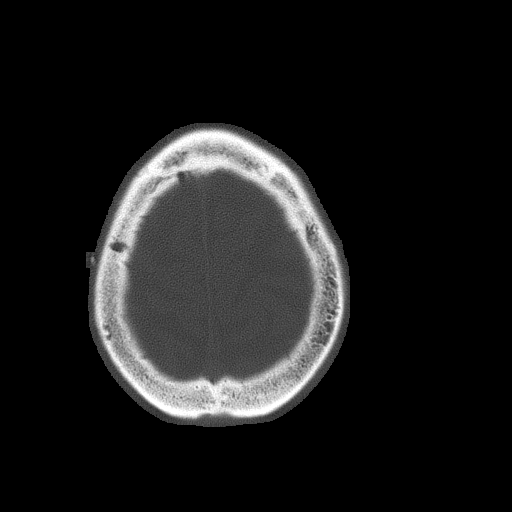
[im 77/85  bone]
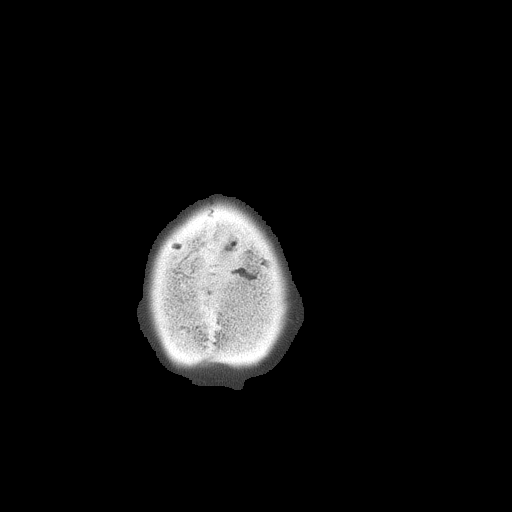

[14 of 30 positions shown; findings below may reference images not displayed]

FINDINGS: Lacunar infarcts observed in the left anterior thalamus and along
the putamen and external capsule on the left, stable from prior.
Small remote lacunar infarct in the right parietal corona radiata,
stable.

Otherwise, the brainstem, cerebellum, cerebral peduncles, thalamus,
basal ganglia, basilar cisterns, and ventricular system appear
within normal limits. No intracranial hemorrhage, mass lesion, or
acute CVA.

There is chronic left maxillary sinusitis along with a chronic right
mastoid effusion.
IMPRESSION: 1. No intracranial hemorrhage or acute intracranial findings.
2. Several stable remote small lacunar infarcts.
3. Chronic left maxillary sinusitis.
4. Chronic right mastoid effusion.

## 2014-02-06 ENCOUNTER — Ambulatory Visit: Payer: Self-pay | Admitting: Vascular Surgery

## 2014-02-06 LAB — BASIC METABOLIC PANEL
ANION GAP: 6 — AB (ref 7–16)
BUN: 23 mg/dL — AB (ref 7–18)
CALCIUM: 9.2 mg/dL (ref 8.5–10.1)
CHLORIDE: 104 mmol/L (ref 98–107)
Co2: 25 mmol/L (ref 21–32)
Creatinine: 1.43 mg/dL — ABNORMAL HIGH (ref 0.60–1.30)
EGFR (African American): 52 — ABNORMAL LOW
EGFR (Non-African Amer.): 45 — ABNORMAL LOW
Glucose: 129 mg/dL — ABNORMAL HIGH (ref 65–99)
Osmolality: 275 (ref 275–301)
POTASSIUM: 4.2 mmol/L (ref 3.5–5.1)
SODIUM: 135 mmol/L — AB (ref 136–145)

## 2014-03-30 ENCOUNTER — Ambulatory Visit: Payer: Self-pay | Admitting: Internal Medicine

## 2014-04-05 ENCOUNTER — Observation Stay: Payer: Self-pay | Admitting: Internal Medicine

## 2014-04-05 LAB — COMPREHENSIVE METABOLIC PANEL
ALBUMIN: 3.2 g/dL — AB (ref 3.4–5.0)
ALK PHOS: 136 U/L — AB
ANION GAP: 9 (ref 7–16)
BUN: 41 mg/dL — AB (ref 7–18)
Bilirubin,Total: 0.9 mg/dL (ref 0.2–1.0)
CHLORIDE: 101 mmol/L (ref 98–107)
CREATININE: 1.98 mg/dL — AB (ref 0.60–1.30)
Calcium, Total: 8.7 mg/dL (ref 8.5–10.1)
Co2: 24 mmol/L (ref 21–32)
EGFR (African American): 42 — ABNORMAL LOW
EGFR (Non-African Amer.): 35 — ABNORMAL LOW
Glucose: 321 mg/dL — ABNORMAL HIGH (ref 65–99)
Osmolality: 291 (ref 275–301)
Potassium: 4.5 mmol/L (ref 3.5–5.1)
SGOT(AST): 26 U/L (ref 15–37)
SGPT (ALT): 37 U/L
SODIUM: 134 mmol/L — AB (ref 136–145)
Total Protein: 8 g/dL (ref 6.4–8.2)

## 2014-04-05 LAB — CK TOTAL AND CKMB (NOT AT ARMC)
CK, TOTAL: 98 U/L
CK-MB: 4.8 ng/mL — AB (ref 0.5–3.6)

## 2014-04-05 LAB — CBC
HCT: 37.9 % — AB (ref 40.0–52.0)
HGB: 11.9 g/dL — ABNORMAL LOW (ref 13.0–18.0)
MCH: 26.6 pg (ref 26.0–34.0)
MCHC: 31.3 g/dL — AB (ref 32.0–36.0)
MCV: 85 fL (ref 80–100)
PLATELETS: 201 10*3/uL (ref 150–440)
RBC: 4.46 10*6/uL (ref 4.40–5.90)
RDW: 16.9 % — AB (ref 11.5–14.5)
WBC: 10.9 10*3/uL — AB (ref 3.8–10.6)

## 2014-04-05 LAB — CK-MB: CK-MB: 3.7 ng/mL — ABNORMAL HIGH (ref 0.5–3.6)

## 2014-04-05 LAB — PRO B NATRIURETIC PEPTIDE: B-Type Natriuretic Peptide: 33028 pg/mL — ABNORMAL HIGH (ref 0–450)

## 2014-04-05 LAB — TROPONIN I
Troponin-I: 1.6 ng/mL — ABNORMAL HIGH
Troponin-I: 1.5 ng/mL — ABNORMAL HIGH

## 2014-04-05 LAB — PROTIME-INR
INR: 1.1
PROTHROMBIN TIME: 14.3 s (ref 11.5–14.7)

## 2014-04-06 LAB — BASIC METABOLIC PANEL
ANION GAP: 9 (ref 7–16)
BUN: 37 mg/dL — AB (ref 7–18)
CALCIUM: 8.4 mg/dL — AB (ref 8.5–10.1)
CREATININE: 1.66 mg/dL — AB (ref 0.60–1.30)
Chloride: 103 mmol/L (ref 98–107)
Co2: 23 mmol/L (ref 21–32)
EGFR (African American): 51 — ABNORMAL LOW
EGFR (Non-African Amer.): 42 — ABNORMAL LOW
Glucose: 140 mg/dL — ABNORMAL HIGH (ref 65–99)
Osmolality: 281 (ref 275–301)
Potassium: 4.2 mmol/L (ref 3.5–5.1)
SODIUM: 135 mmol/L — AB (ref 136–145)

## 2014-04-06 LAB — TROPONIN I: TROPONIN-I: 1.5 ng/mL — AB

## 2014-04-06 LAB — CBC WITH DIFFERENTIAL/PLATELET
BASOS PCT: 0.4 %
Basophil #: 0 10*3/uL (ref 0.0–0.1)
Eosinophil #: 0.2 10*3/uL (ref 0.0–0.7)
Eosinophil %: 1.9 %
HCT: 32.5 % — ABNORMAL LOW (ref 40.0–52.0)
HGB: 10.7 g/dL — ABNORMAL LOW (ref 13.0–18.0)
LYMPHS PCT: 14 %
Lymphocyte #: 1.4 10*3/uL (ref 1.0–3.6)
MCH: 27.1 pg (ref 26.0–34.0)
MCHC: 32.9 g/dL (ref 32.0–36.0)
MCV: 82 fL (ref 80–100)
Monocyte #: 0.9 x10 3/mm (ref 0.2–1.0)
Monocyte %: 9.2 %
Neutrophil #: 7.2 10*3/uL — ABNORMAL HIGH (ref 1.4–6.5)
Neutrophil %: 74.5 %
Platelet: 197 10*3/uL (ref 150–440)
RBC: 3.96 10*6/uL — ABNORMAL LOW (ref 4.40–5.90)
RDW: 16.9 % — ABNORMAL HIGH (ref 11.5–14.5)
WBC: 9.7 10*3/uL (ref 3.8–10.6)

## 2014-04-06 LAB — LIPID PANEL
CHOLESTEROL: 90 mg/dL (ref 0–200)
HDL: 40 mg/dL (ref 40–60)
Ldl Cholesterol, Calc: 36 mg/dL (ref 0–100)
Triglycerides: 69 mg/dL (ref 0–200)
VLDL CHOLESTEROL, CALC: 14 mg/dL (ref 5–40)

## 2014-04-06 LAB — CK-MB: CK-MB: 3.2 ng/mL (ref 0.5–3.6)

## 2014-04-07 LAB — BASIC METABOLIC PANEL
Anion Gap: 10 (ref 7–16)
BUN: 33 mg/dL — AB (ref 7–18)
CREATININE: 1.74 mg/dL — AB (ref 0.60–1.30)
Calcium, Total: 8.3 mg/dL — ABNORMAL LOW (ref 8.5–10.1)
Chloride: 106 mmol/L (ref 98–107)
Co2: 24 mmol/L (ref 21–32)
GFR CALC AF AMER: 49 — AB
GFR CALC NON AF AMER: 40 — AB
Glucose: 100 mg/dL — ABNORMAL HIGH (ref 65–99)
OSMOLALITY: 287 (ref 275–301)
POTASSIUM: 4.2 mmol/L (ref 3.5–5.1)
SODIUM: 140 mmol/L (ref 136–145)

## 2014-04-07 LAB — CBC WITH DIFFERENTIAL/PLATELET
BASOS ABS: 0.1 10*3/uL (ref 0.0–0.1)
Basophil %: 0.9 %
EOS ABS: 0.1 10*3/uL (ref 0.0–0.7)
EOS PCT: 1.8 %
HCT: 34.6 % — ABNORMAL LOW (ref 40.0–52.0)
HGB: 11 g/dL — ABNORMAL LOW (ref 13.0–18.0)
Lymphocyte #: 1.7 10*3/uL (ref 1.0–3.6)
Lymphocyte %: 21.8 %
MCH: 26.8 pg (ref 26.0–34.0)
MCHC: 31.7 g/dL — AB (ref 32.0–36.0)
MCV: 85 fL (ref 80–100)
MONOS PCT: 7.4 %
Monocyte #: 0.6 x10 3/mm (ref 0.2–1.0)
NEUTROS ABS: 5.3 10*3/uL (ref 1.4–6.5)
Neutrophil %: 68.1 %
PLATELETS: 209 10*3/uL (ref 150–440)
RBC: 4.09 10*6/uL — ABNORMAL LOW (ref 4.40–5.90)
RDW: 16.3 % — AB (ref 11.5–14.5)
WBC: 7.8 10*3/uL (ref 3.8–10.6)

## 2014-04-08 LAB — CBC WITH DIFFERENTIAL/PLATELET
Basophil #: 0 10*3/uL (ref 0.0–0.1)
Basophil %: 0.2 %
Eosinophil #: 0 10*3/uL (ref 0.0–0.7)
Eosinophil %: 0 %
HCT: 35.4 % — AB (ref 40.0–52.0)
HGB: 11.2 g/dL — AB (ref 13.0–18.0)
LYMPHS PCT: 3 %
Lymphocyte #: 0.4 10*3/uL — ABNORMAL LOW (ref 1.0–3.6)
MCH: 26.8 pg (ref 26.0–34.0)
MCHC: 31.7 g/dL — AB (ref 32.0–36.0)
MCV: 85 fL (ref 80–100)
MONO ABS: 1.1 x10 3/mm — AB (ref 0.2–1.0)
Monocyte %: 7.6 %
Neutrophil #: 12.9 10*3/uL — ABNORMAL HIGH (ref 1.4–6.5)
Neutrophil %: 89.2 %
Platelet: 226 10*3/uL (ref 150–440)
RBC: 4.19 10*6/uL — ABNORMAL LOW (ref 4.40–5.90)
RDW: 16.8 % — ABNORMAL HIGH (ref 11.5–14.5)
WBC: 14.5 10*3/uL — ABNORMAL HIGH (ref 3.8–10.6)

## 2014-04-08 LAB — BASIC METABOLIC PANEL
Anion Gap: 10 (ref 7–16)
BUN: 35 mg/dL — ABNORMAL HIGH (ref 7–18)
CALCIUM: 8.4 mg/dL — AB (ref 8.5–10.1)
Chloride: 107 mmol/L (ref 98–107)
Co2: 21 mmol/L (ref 21–32)
Creatinine: 1.7 mg/dL — ABNORMAL HIGH (ref 0.60–1.30)
EGFR (Non-African Amer.): 41 — ABNORMAL LOW
GFR CALC AF AMER: 50 — AB
Glucose: 164 mg/dL — ABNORMAL HIGH (ref 65–99)
OSMOLALITY: 287 (ref 275–301)
POTASSIUM: 4.4 mmol/L (ref 3.5–5.1)
SODIUM: 138 mmol/L (ref 136–145)

## 2014-04-08 LAB — HEMOGLOBIN A1C: Hemoglobin A1C: 8.5 % — ABNORMAL HIGH (ref 4.2–6.3)

## 2014-04-29 ENCOUNTER — Ambulatory Visit: Payer: Self-pay | Admitting: Internal Medicine

## 2014-04-29 DEATH — deceased

## 2014-09-16 NOTE — Consult Note (Signed)
CC: Gallbladder full of stones/surgery possible bile leak.  Pt has drains in, small amt of bile, some drainage at dressing site.  Abd some distention, no peritoneal signs away from op site.  TB down some today.  No fever, Dr. Bluford Kaufmannh will see patient tomorrow and he will follow after that, may or may not need ERCP.  Electronic Signatures: Scot JunElliott, Ryland T (MD)  (Signed on 15-Oct-13 17:41)  Authored  Last Updated: 15-Oct-13 17:41 by Scot JunElliott, Roshun T (MD)

## 2014-09-16 NOTE — Consult Note (Signed)
Brief Consult Note: Diagnosis: POC, patient with moderate risk factors for low risk procedure, overall acceptable risk.   Patient was seen by consultant.   Consult note dictated.   Comments: REC  Agree with current therapy, defer Lovenox bridge, proceed with surgery, cont carvedilol pre-, peri- and post-op.  Electronic Signatures: Marcina MillardParaschos, Minsa Weddington (MD)  (Signed 10-Oct-13 12:49)  Authored: Brief Consult Note   Last Updated: 10-Oct-13 12:49 by Marcina MillardParaschos, Donesha Wallander (MD)

## 2014-09-16 NOTE — Consult Note (Signed)
PATIENT NAME:  Maxwell Hurley, Chick S MR#:  [id number] DATE OF BIRTH:  Jan 09, 1930  DATE OF CONSULTATION:  03/08/2012  CONSULTING PHYSICIAN:  Marcina MillardAlexander Andrewjames Weirauch, MD  PRIMARY CARE PHYSICIAN: Barbette ReichmannVishwanath Hande, MD   CHIEF COMPLAINT: Nausea and vomiting.   REASON FOR CONSULTATION: Consultation requested for preoperative cardiovascular evaluation prior to gallbladder surgery.   HISTORY OF PRESENT ILLNESS: The patient is an 81 year old gentleman with known history of cardiomyopathy and atrial fibrillation who presents with a 12-hour history of intractable nausea and vomiting. Admission labs were notable for elevated creatinine of 1.8, elevated white count of 16,000. CT scan revealed gallstones with symptoms suggestive of acute cholecystitis. The patient is awaiting probable laparoscopic cholecystectomy. The patient currently denies chest pain or shortness of breath. EKG on admission revealed atrial fibrillation with left bundle branch block and PVCs.   PAST MEDICAL/SURGICAL HISTORY:  1. Dilated cardiomyopathy.  2. Chronic atrial fibrillation.  3. Hypertension.  4. Diabetes.  5. Questionable history of transient ischemic attack in 2001.  6. Questionable history of non-ST elevation myocardial infarction.  7. Chronic renal insufficiency.  8. 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  9. Right hip fracture, status post fixation.   MEDICATIONS ON ADMISSION:  1. Coumadin 5 mg daily.  2. Carvedilol 12.5 mg b.i.d.  3. Lovastatin 20 mg daily.  4. Furosemide 40 mg daily.  5. Lisinopril 10 mg daily.  6. Zoloft 25 mg daily.  7. Glipizide 5 mg daily.  8. Protonix 40 mg daily.  9. Phenergan 25 mg every 6 hours p.r.n.  10. Tramadol 50 mg every 4 hours p.r.n.   SOCIAL HISTORY: The patient currently resides with his wife and daughter. He denies tobacco abuse.   FAMILY HISTORY: No immediate family history of coronary  artery disease or myocardial infarction.    REVIEW OF SYSTEMS: CONSTITUTIONAL: No fever or chills. EYES: No blurry vision. EARS: No hearing loss. RESPIRATORY: No shortness of breath. CARDIOVASCULAR: No chest pain. GI: The patient had intractable nausea and vomiting as described above. GU: No dysuria or hematuria. ENDOCRINE: No polyuria or polydipsia. MUSCULOSKELETAL: No arthralgias or myalgias. NEUROLOGICAL: No focal muscle weakness or numbness. PSYCHOLOGICAL: No depression or anxiety.   PHYSICAL EXAMINATION:  VITAL SIGNS: Blood pressure 129/68, pulse 73, respirations 18, temperature 98.8, pulse oximetry 93%.   HEENT: Pupils are equal and reactive to light and accommodation.   NECK: Supple without thyromegaly.   LUNGS: Clear.   HEART: Normal jugular venous pressure. Diffuse point of maximal impulse. Regular rate and rhythm. Normal S1, S2. No appreciable gallop, murmur, or rub.   ABDOMEN: Soft and nontender. There are decreased bowel sounds.   MUSCULOSKELETAL: Normal muscle tone.   NEUROLOGIC: The patient is alert and oriented x3. Motor and sensory are both grossly intact.   IMPRESSION: An 81 year old gentleman who presents with acute cholecystitis awaiting probable laparoscopic cholecystectomy. The patient does have a history of dilated cardiomyopathy and atrial fibrillation. High risk factors for cardiovascular complication include diabetes. The patient does not appear to be experiencing any chest pain or have active symptoms of congestive heart failure. He does have mild renal failure which is exacerbated by dehydration. The patient has a questionable history of prior transient ischemic attack or myocardial infarction. The patient has a moderate degree of risk factors for a low risk procedure. I would assess his risk to be acceptable, and the patient appears to be optimized medically.   RECOMMENDATIONS:  1. I agree  with current therapy.  2. The patient does not require a Lovenox bridge,  would continue to hold warfarin.  3. Continue carvedilol pre-, peri and postoperatively.  4. I would defer further noninvasive cardiac evaluation at this time.  ____________________________ Marcina Millard, MD ap:cbb D: 03/08/2012 12:47:39 ET T: 03/08/2012 13:33:26 ET JOB#: [id number]  cc: Marcina Millard, MD, <Dictator> Marcina Millard MD ELECTRONICALLY SIGNED 03/16/2012 15:07

## 2014-09-16 NOTE — H&P (Signed)
PATIENT NAME:  Maxwell Hurley, Maxwell Hurley MR#:  [id number] DATE OF BIRTH:  13-Dec-1929  DATE OF ADMISSION:  03/07/2012  PRIMARY CARE PHYSICIAN: [facility]   CARDIOLOGIST: Dr. [facility]    ADMITTING PHYSICIAN: Dr. Michela Pitcher    CHIEF COMPLAINT: Nausea and vomiting.   BRIEF HISTORY: Mr. Maxwell Hurley is an 81 year old gentleman seen in the Emergency Room with an approximately 12 hour history of profound nausea and vomiting. His daughter noted that he was retching this morning and having dry heaves. She gave him some Phenergan as he often has GI symptoms. His symptoms did not improve and he became progressively weaker. She related that they brought him to the hospital for further evaluation. Work-up in the Emergency Room demonstrated significant lab abnormalities with a creatinine of 1.84 over a baseline of 1.5, bilirubin of 4.1, alkaline phosphatase 167, elevated transaminases, normal lipase, white blood cell count 16,000, platelet count 128,000. Urinalysis was largely unremarkable. Pro-time was 38.1.   He underwent a CT scan of the abdomen which demonstrated some pneumobilia and some gallstones with possible gallbladder wall thickening and possibly some pericholecystic fluid. The surgical service was consulted.   The patient ha a long history of biliary tract disease. He's been evaluated on several occasions in the past for symptomatic biliary tract disease with one previous admission for elevated bilirubin and gallbladder symptoms suggestive of acute cholecystitis. He was seen by Dr. Renda Rolls at that time and because of his multiple comorbidities and the fact that he improved on antibiotic therapy surgery was not offered. He has been turned down one other time for surgical intervention. His daughter relates that he continues to have significant abdominal discomfort on an intermittent basis primarily after eating and they keep Phenergan on hand for his longstanding nausea. He dehydrates  easily, becomes confused and quite weak. She denies any history of hepatitis, pancreatitis, peptic ulcer disease, or diverticulitis. He does have a history of previous jaundice with one of his episodes of gallbladder disease. He has not had any previous surgery on his abdomen. He denies any other constitutional symptoms.   PAST MEDICAL HISTORY: His medical history is significant for multiple medical problems including:  1. Chronic atrial fibrillation, currently on anticoagulation. 2. Severe cardiomyopathy and coronary artery disease with an estimated ejection fraction of 25%. Catheterization apparently noted severe coronary artery disease but was felt to be nonsurgical.   3. Hypertension. 4. Non-insulin-dependent diabetes.   5. Chronic renal insufficiency maintaining a baseline creatinine of 1.5.  6. History of stroke in the past. 7. Multiple TIAs.  8. He's recently had severe lower extremity vascular occlusive disease undergoing multiple angioplasties finally resulting in great toe and second toe amputation on the left foot performed in the summer of 2013.  9. He also had a right hip fracture in the spring 2013 and underwent fixation.   CURRENT MEDICATIONS:  1. Zoloft 25 mg p.o. daily.  2. Carvedilol 12.5 mg b.i.d.  3. Lovastatin 20 mg once a day.  4. Coumadin 5 mg once a day. 5. Furosemide 40 mg once a day  6. Glipizide 5 mg b.i.d.  7. Lisinopril 10 mg p.o. daily.  8. Protonix 40 mg p.o. daily.  9. Phenergan 25 mg p.o. q.6 p.r.n.  10. Tramadol 50 mg p.o. q.4 hours p.r.n.   ALLERGIES: He is allergic to sulfa drugs.   SOCIAL HISTORY: He is not a cigarette smoker currently. He lives with his daughter. He is regularly followed by [facility], Dr.  Arnoldo HookerBruce Kowalski, and by Dr. Marcello FennelHande, Internal Medicine department having seen him two weeks ago for routine follow-up evaluation.   FAMILY HISTORY: Noncontributory.   REVIEW OF SYSTEMS: Review of systems is undertaken with the  patient and is otherwise unremarkable.   PHYSICAL EXAMINATION:   VITAL SIGNS: Blood pressure 141/69, heart rate 78, respirations 18, temperature 99.5, oxygen saturation 99% on room air.   HEENT: His HEENT exam does not reveal any scleral icterus. He has normal equally round pupils with no evidence of any pupillary abnormality. He has no facial deformity.   NECK: His neck is rigid. He really will not relax his neck but denies any pain or neurologic symptoms. Does not have any adenopathy. His trachea is midline.   CHEST: Clear with no adventitious sounds. He has equal breath sounds. They are distant. He has normal pulmonary excursion.   CARDIAC: Split S1, 2/6 systolic murmur, irregular rhythm.   ABDOMEN: His abdomen is tender and tense. Is not distended. He has discomfort in his left upper quadrant and left flank. He has no right upper quadrant tenderness. He has hypoactive bowel sounds, some mild guarding on the left side but no rebound. He has no inguinal hernias. He has minimal pulses in both groins and no distal pulses in either foot with surgical evidence of his great toe and second toe on the left foot.   NEUROLOGIC: Normal orientation. He is slightly confused and a little bit agitated.   I have independently reviewed the CT scan. He does appear to have pneumobilia with no obvious etiology for the pneumobilia. There does not appear to be any evidence of previous ductal manipulation. He does not have any evidence of bowel ischemia. He does have multiple gallstones noted with some soft findings of acute cholecystitis.   IMPRESSION: Despite his multiple medical problems, because of the pneumobilia the surgical service has been consulted with regard to possible surgical intervention acutely. With his severe anti-coagulation and his lack of cholangitic or bacteremic symptoms, we will admit him to the hospital, place him on intravenous antibiotics, rehydration, and have him seen by Cardiology and  Internal Medicine. Will stop his Coumadin at the present time but will not start a heparin or Lovenox bridge until decision made by the Cardiology service. Will ask for risk assessment in order to better determine his surgical risks since he has been turned down for surgery on two previous occasions.   This plan has been outlined to the patient and his daughter in detail. They are in agreement.   ____________________________ Carmie Endalph L. Ely III, MD rle:drc D: 03/07/2012 22:59:25 ET T: 03/08/2012 06:02:43 ET JOB#: [id number]  cc: Quentin Orealph L. Ely III, MD, <Dictator> Quentin OreALPH L ELY MD ELECTRONICALLY SIGNED 03/09/2012 22:52
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
## 2014-09-16 NOTE — Consult Note (Signed)
CC: abd pain.  He has a prominent mass in RUQ which is tender.  Likely a gall bladder full of stones with distention.  I can't rule out stones and neoplasm.  He clearly needs gall bladder removal.  I gave him a sub cut. dose of vit K 10mg  today to see if it will reverse his coumadin enough to have surgery soon.  Case discussed with Dr. Bluford Kaufmannh and Dr. Excell Seltzerooper,  and both favor surgery and possible post surgery ERCP if still needed.  The pneumobilia reported on an imaging study raises the chance of cholangitis and makes aggressive therapy more likely to benefit him.  He has signif cardiac disease with EF of 25% and has peripheral vascular disease.Pt very pleasant and understands the plan.  Electronic Signatures: Scot JunElliott, Alpheus T (MD)  (Signed on 10-Oct-13 16:59)  Authored  Last Updated: 10-Oct-13 16:59 by Scot JunElliott, Jedi T (MD)

## 2014-09-16 NOTE — Op Note (Signed)
PATIENT NAME:  Maxwell Hurley, Maxwell Hurley MR#:  [id number] DATE OF BIRTH:  1930/05/25  DATE OF PROCEDURE:  03/11/2012  PREOPERATIVE DIAGNOSIS: Acute cholecystitis with choledocholithiasis.   POSTOPERATIVE DIAGNOSIS: Acute cholecystitis with choledocholithiasis.   PROCEDURE: Laparoscopic cholecystectomy with attempted cholangiography.   SURGEON: Kalea Perine E. Excell Seltzer, M.D.   ANESTHESIA: General with endotracheal tube.   INDICATIONS: This is a patient with a history of probable cholangitis which has resolved on antibiotics but still has signs of right upper quadrant pain suggestive of acute cholecystitis.   Preoperatively we discussed the rationale for surgery, the options of observation, the risk of bleeding, infection, conversion to an open procedure, open common bile duct exploration, retained common bile duct stone, bile duct damage, and bile duct leak any of which could require further surgery and/or ERCP, stent, and papillotomy. This was all reviewed for him. Dr. Niel Hummer is already on board concerning possible ERCP preoperatively, which was deferred for surgery. The patient understood all this and agreed to proceed.   FINDINGS: A gallbladder with no bile in it, is essentially replaced with a solid mass of tiny stones involving the entire gallbladder. Extensive adhesions to the omentum, colon, liver, and abdominal wall. The cystic duct was essentially obliterated and could not be cannulated and retracted.   DESCRIPTION OF PROCEDURE: The patient was induced to general anesthesia. He was already on IV antibiotics but booster was given. VTE prophylaxis was in place. He was prepped and draped in a sterile fashion. Marcaine was infiltrated in the skin and subcutaneous tissues around the periumbilical area, an incision was made, Veress needle was placed, pneumoperitoneum was obtained, and a 5 mm trocar port was placed. The abdominal cavity was explored and under direct vision a 10 mm epigastric port and two lateral  5 mm ports were placed. The gallbladder was not easily identified. It was easily seen to be covered with omentum and this was adherent to the anterior abdominal wall. The anterior abdominal wall adhesions were taken down sharply and it was noted that these were long-standing adhesions that were very dense. Further dissection across the liver to remove the omentum from the liver ultimately delineated the fundus of the gallbladder. It was noted that the fundus of the gallbladder was very hard and replaced with essentially stone casing. Dissection using both sharp dissection and blunt dissection was performed to remove the omentum from the fundus of the gallbladder and work down towards the infundibulum of the gallbladder. In so doing the cystic duct appeared to be torn from the gallbladder with a large amount of scar tissue in this area, which was very dense and long-standing fibrous type scar. What was believed to be the cystic duct was marked with a clip somewhat away from it so as to not damage the common duct and further dissection of the gallbladder was performed taking it from the gallbladder fossa. Spillage of large amounts of black stone was performed. This was retrieved with stone scooper and suction. Ultimately the gallbladder was passed out through the epigastric port site with the aid of an Endo Catch bag. Essentially no cautery was used on the gallbladder fossa as there was no bleeding from this area.   The cystic artery was never identified and was not bleeding.   Attention was turned to the area of what was believed to be the cystic duct. An additional port was placed to aid in retraction and a 30 mm side-viewing scope was utilized to identify this area and through one of the 5  mm ports was placed first a 5112 [place] and then an 8 [place] red Robinson catheter. This was attempted to cannulate the cystic duct, but it was completely obliterated. No bile ever exuded from this cystic duct and there was not  much of a stump, in fact it appeared to be right on the common bile duct. An attempt at grasping it lightly to lift up and place a clip was futile. Therefore two clips were placed adjacent to it for future radiographic studies if needed and an attempt at cholangiogram or cannulation of this was abandoned due to the risk of damaging or injuring the common bile duct and there was no bile emanating from the cystic duct at any point as it was obliterated.   The area was irrigated with copious amounts of normal saline. Hemostasis was adequate. Additional stones were scooped as much as possible and removed and then through two lateral port sites were placed two separate 10 mm JP drains. The foramen of Winslow was obliterated. It could not be access, therefore, both drains were placed into the gallbladder fossa and held in with 3-0 nylon. Again, inspection of the cystic duct area failed to identify any bile coming from it and there was no sign of bile leak and no sign of bowel injury. Therefore, at this point, pneumoperitoneum was released, all ports were removed, and the enlarged epigastric port site was closed with multiple figure-of-eight 0 Vicryls. It had to be enlarged to get the greatly oversized gallbladder out with its stones. Then skin staples were placed at all incisions and additional Marcaine was placed for a total of 30 mL. Drains were placed to bulb suction.   The patient tolerated this procedure well. There were no complications other than the inability to control the cystic duct, which appeared to be obliterated, and drains have been placed. Estimated blood loss was 200 mL. Sponge, lap and needle counts were correct. ____________________________ Adah Salvageichard E. Excell Seltzerooper, MD rec:slb D: 03/11/2012 09:55:06 ET T: 03/11/2012 11:12:59 ET JOB#: [id number]  cc: Adah Salvageichard E. Excell Seltzerooper, MD, <Dictator> Lattie HawICHARD E Icholas Irby MD ELECTRONICALLY SIGNED 03/11/2012 12:27

## 2014-09-16 NOTE — Consult Note (Signed)
Brief Consult Note: Diagnosis: Multiple gallstones with pneumobilia, leukocytosis, abdominal mild tenderness on right mildly frim, boewl sounds present and BM today.   Patient was seen by consultant.   Consult note dictated.   Recommend further assessment or treatment.   Comments: Case discussed with Dr. Mechele CollinElliott and Dr. Bluford Kaufmannh regarding need for ERCP before cholecystectomy.  Plan: Dr. Mechele CollinElliott to discuss reversing the pro-time with vitamin K with Dr. Cassie FreerParachos thereby accelerating  the availability of  surgery. Dr. Bluford Kaufmannh notes that this patient is at high risk for complications of infection if surgery is prolonged. Dr. Bluford Kaufmannh mentioned that he can perform the ERCP after the surgery is completed, as well. Further gi recommendations pending.  Electronic Signatures: Rowan BlaseMills, Iyah Laguna Ann (NP)  (Signed 10-Oct-13 14:39)  Authored: Brief Consult Note   Last Updated: 10-Oct-13 14:39 by Rowan BlaseMills, Hermenegildo Clausen Ann (NP)

## 2014-09-16 NOTE — Consult Note (Signed)
Chief Complaint:   Subjective/Chief Complaint Feels well. No abdominal pain or vomiting. Passing gas.   VITAL SIGNS/ANCILLARY NOTES: **Vital Signs.:   12-Oct-13 05:27   Vital Signs Type Q 4hr   Temperature Temperature (F) 98.5   Celsius 36.9   Pulse Pulse 78   Respirations Respirations 18   Systolic BP Systolic BP 277   Diastolic BP (mmHg) Diastolic BP (mmHg) 75   Mean BP 91   Pulse Ox % Pulse Ox % 95   Pulse Ox Activity Level  At rest   Oxygen Delivery Room Air/ 21 %   Brief Assessment:   Additional Physical Exam Mildly jaundiced. Non toxic. Abdomen is somewhat firm. No tenderness.   Lab Results: Routine Chem:  12-Oct-13 05:13    Glucose, Serum  109   BUN  28   Creatinine (comp)  2.01   Sodium, Serum 140   Potassium, Serum 3.8   Chloride, Serum 106   CO2, Serum 24   Calcium (Total), Serum  7.9   Anion Gap 10   Osmolality (calc) 285   eGFR (African American)  35   eGFR (Non-African American)  30 (eGFR values <78m/min/1.73 m2 may be an indication of chronic kidney disease (CKD). Calculated eGFR is useful in patients with stable renal function. The eGFR calculation will not be reliable in acutely ill patients when serum creatinine is changing rapidly. It is not useful in  patients on dialysis. The eGFR calculation may not be applicable to patients at the low and high extremes of body sizes, pregnant women, and vegetarians.)  Routine Coag:  12-Oct-13 05:13    Prothrombin  17.6   INR 1.4 (INR reference interval applies to patients on anticoagulant therapy. A single INR therapeutic range for coumarins is not optimal for all indications; however, the suggested range for most indications is 2.0 - 3.0. Exceptions to the INR Reference Range may include: Prosthetic heart valves, acute myocardial infarction, prevention of myocardial infarction, and combinations of aspirin and anticoagulant. The need for a higher or lower target INR must be assessed  individually. Reference: The Pharmacology and Management of the Vitamin K  antagonists: the seventh ACCP Conference on Antithrombotic and Thrombolytic Therapy. COEUMP.5361Sept:126 (3suppl): [id number] A HCT value >55% may artifactually increase the PT.  In one study,  the increase was an average of 25%. Reference:  "Effect on Routine and Special Coagulation Testing Values of Citrate Anticoagulant Adjustment in Patients with High HCT Values." American Journal of Clinical Pathology 2006;126:400-405.)  Routine Hem:  12-Oct-13 05:13    WBC (CBC) 8.5   RBC (CBC)  4.32   Hemoglobin (CBC)  12.9   Hematocrit (CBC)  36.8   Platelet Count (CBC)  106   MCV 85   MCH 29.8   MCHC 34.9   RDW  15.8   Neutrophil % 75.8   Lymphocyte % 14.4   Monocyte % 7.4   Eosinophil % 1.9   Basophil % 0.5   Neutrophil # 6.5   Lymphocyte # 1.2   Monocyte # 0.6   Eosinophil # 0.2   Basophil # 0.0 (Result(s) reported on 10 Mar 2012 at 06:13AM.)   Assessment/Plan:  Assessment/Plan:   Assessment Cholelithiasis with ? cholecystitis, clinically better. ? Cholangitis on admission with pneumobilia. Clinically much better and does not appear to be cholangitic anymore. No fever or leucocytosis. LFT's were trending down although the ones from today are pending. INR = 1.4.    Plan Dus to significant clinical improvement and concern about potential  risks associated with anesthesia and ERCP, I will proceed with an MRCP first. Will repeat LFT's today as well and make further recommendations depending on these tests.   Electronic Signatures: Jill Side (MD)  (Signed 12-Oct-13 10:11)  Authored: Chief Complaint, VITAL SIGNS/ANCILLARY NOTES, Brief Assessment, Lab Results, Assessment/Plan   Last Updated: 12-Oct-13 10:11 by Jill Side (MD)

## 2014-09-16 NOTE — Consult Note (Signed)
CC: cholecystitis with stones.  VSS afebrile, creat 2.2, WBC 14, TB 3.4, other LFT's down significantly.  His INR was 2.9 after the vit K.  Dr. Bluford Kaufmannh and Dr. Excell Seltzerooper discussed the possible courses of action.  Surgery or ERCP first.  He feels a little better today with mildly reduced pain and tenderness.  Chest clear on exam, still palpable fullness in right abd, somewhat less tender.  Dr. Niel HummerIftikhar will cover this weekend and Dr. Bluford Kaufmannh has left him information about this patient.  Continue antibiotics for now.  Electronic Signatures: Scot JunElliott, Lyndol T (MD)  (Signed on 11-Oct-13 18:05)  Authored  Last Updated: 11-Oct-13 18:05 by Scot JunElliott, Essie T (MD)

## 2014-09-16 NOTE — Consult Note (Signed)
Chief Complaint:   Subjective/Chief Complaint Discussed with Dr. Excell Seltzerooper. please see his note as well. MRCP was unremarkable yesterday. Underwent cholecystectomy todsy. Gallbladder was full of stones and was essentially calcified. No bile was seen in gallbladder. Cystic duct was partially torn and retracted and could not be properly closed with clips. No bile was noted through the cystic dusct suggesting scarring of cystic duct. Possibility of cystic duct leak at a later date was raised by Dr. Excell Seltzerooper and prophylactic ERCP with stenting was suggested. Will discuss with primary team and re-evaluate the patient in am to make further determination about further intervention.   Electronic Signatures: Lurline DelIftikhar, Christinia Lambeth (MD)  (Signed 13-Oct-13 10:54)  Authored: Chief Complaint   Last Updated: 13-Oct-13 10:54 by Lurline DelIftikhar, Aquinnah Devin (MD)

## 2014-09-16 NOTE — Consult Note (Signed)
Chief Complaint:   Subjective/Chief Complaint Asked by Dr. Vira Agar and surgery to evaluate patient for poss ERCP to prevent bile leak since cystic duct not clipped.  Min bile drainage by one of the JP drain. No ad pain.   VITAL SIGNS/ANCILLARY NOTES: **Vital Signs.:   16-Oct-13 15:21   Vital Signs Type Q 4hr   Temperature Temperature (F) 97.9   Celsius 36.6   Temperature Source Oral   Pulse Pulse 58   Respirations Respirations 18   Systolic BP Systolic BP 409   Diastolic BP (mmHg) Diastolic BP (mmHg) 81   Mean BP 99   Pulse Ox % Pulse Ox % 98   Pulse Ox Activity Level  At rest   Oxygen Delivery 2L   Brief Assessment:   Cardiac Regular    Respiratory clear BS    Gastrointestinal Normal   Lab Results: Hepatic:  16-Oct-13 05:38    Bilirubin, Total  2.0   Bilirubin, Direct  1.3 (Result(s) reported on 14 Mar 2012 at 07:00AM.)   Alkaline Phosphatase 83   SGPT (ALT) 18   SGOT (AST)  11   Total Protein, Serum  6.3   Albumin, Serum  2.3  Routine Chem:  16-Oct-13 05:38    Glucose, Serum  144   BUN 17   Creatinine (comp)  1.61   Sodium, Serum 138   Potassium, Serum 3.9   Chloride, Serum 105   CO2, Serum 24   Calcium (Total), Serum  8.2   Anion Gap 9   Osmolality (calc) 280   eGFR (African American)  46   eGFR (Non-African American)  40 (eGFR values <43m/min/1.73 m2 may be an indication of chronic kidney disease (CKD). Calculated eGFR is useful in patients with stable renal function. The eGFR calculation will not be reliable in acutely ill patients when serum creatinine is changing rapidly. It is not useful in  patients on dialysis. The eGFR calculation may not be applicable to patients at the low and high extremes of body sizes, pregnant women, and vegetarians.)   Assessment/Plan:  Assessment/Plan:   Assessment S/P choly. Relatively stable. T.bili coming down.    Plan Discussed at length about pros and cons of doing ERCP and biliary stenting. Discussed potential  complications. At this point, patient does not want to proceed with ERCP.  Hopefully, bile drainage will eventually stop on own. Ok to start anticoagulation. Call [facility] if ERCP needed later. Thanks.   Electronic Signatures: OVerdie Shire(MD)  (Signed 16-Oct-13 16:28)  Authored: Chief Complaint, VITAL SIGNS/ANCILLARY NOTES, Brief Assessment, Lab Results, Assessment/Plan   Last Updated: 16-Oct-13 16:28 by OVerdie Shire(MD)

## 2014-09-16 NOTE — Consult Note (Signed)
Chief Complaint:   Subjective/Chief Complaint Case discussed with Dr. Bluford Kaufmannh who will evaluate the patient later today to see if an ERCP is needed in next few days.   Electronic Signatures: Lurline DelIftikhar, Loida Calamia (MD)  (Signed 14-Oct-13 10:36)  Authored: Chief Complaint   Last Updated: 14-Oct-13 10:36 by Lurline DelIftikhar, Zyaire Dumas (MD)

## 2014-09-16 NOTE — Consult Note (Signed)
CC jaundice and gall stones..  Pt had gall bladder out, surgical note reviewed.  he has two drains in, one draining bile.  No complaints of pain at this time.  Breathing ok.  Discussed case with Dr. Bluford Kaufmannh and plan to observe for next 36-48 hours and if bile drainage continues then consider ERCP with stent.  Discussed with daughter.    Electronic Signatures: Scot JunElliott, Newel T (MD)  (Signed on 14-Oct-13 19:17)  Authored  Last Updated: 14-Oct-13 19:17 by Scot JunElliott, Keanen T (MD)

## 2014-09-16 NOTE — Discharge Summary (Signed)
PATIENT NAME:  Maxwell Hurley, Maxwell Hurley MR#:  [id number] DATE OF BIRTH:  15-Dec-1929  DATE OF ADMISSION:  03/07/2012 DATE OF DISCHARGE:  03/15/2012  DISCHARGE DIAGNOSES:  1. Laparoscopic cholecystectomy for acute cholecystitis with obliterated cystic duct and postoperative bowel leak. 2. Chronic atrial fibrillation on anticoagulation.  3. History of severe cardiomyopathy and coronary artery disease with an ejection fraction of 25%.  4. Hypertension.  5. Non-insulin-dependent diabetes.  6. Chronic renal insufficiency.  7. History of cerebrovascular accident and transient ischemic attacks in the past.  8. History of peripheral vascular disease requiring foot amputations.  9. History of right hip fracture.   DISCHARGE MEDICATIONS:  1. Tramadol 50 mg p.o. q.6h. p.r.n. pain.  2. Coumadin 5 mg p.o. daily.  3. Lasix 40 mg p.o. daily p.r.n.  4. Zoloft 25 mg p.o. daily.  5. Carvedilol 12.5 mg p.o. twice a day.  6. Zocor 20 mg p.o. at bedtime.  7. Glipizide 5 mg p.o. b.i.d.  8. Lisinopril 10 mg p.o. q.a.m.  9. Pantoprazole 40 mg p.o. daily.  10. Phenergan 25 mg p.o. q.6h. p.r.n.  11. Tramadol 50 mg p.o. q.4 hours p.r.n.   INDICATION FOR ADMISSION: Mr. Maxwell Hurley is a pleasant 81 year old male with multiple medical problems who comes in with acute cholecystitis requiring laparoscopic cholecystectomy.   CONSULTATIONS: Gastroenterology and internal medicine.   HOSPITAL COURSE: Mr. Maxwell Hurley underwent laparoscopic cholecystectomy. His duct was noted to be obliterated and he was unable to have his cystic duct cannulated or clipped. Two JPs were placed. Postoperatively, he had a small amount of bile coming out of one of his JP drains. Otherwise, he progressed slowly. He was advanced from n.p.o. to clears to regular diet. He was transitioned to p.o. pain meds with minimal pain. He underwent PT, OT resulting in probable need for skilled nursing facilities. Gastroenterology with Dr. Bluford Kaufmannh was consulted initially for bile leak  and offered ERCP which he refused. At the time of discharge, he is taking good p.o. with good p.o. pain control and voiding and stooling spontaneously.   DISCHARGE INSTRUCTIONS:  1. He is to call or return to clinic if he develops fever greater than 101.5, nausea, vomiting, diarrhea, constipation, or change or increase in drain output. 2. He is to have his JP drains recorded and brought with him to clinic with Dr. Excell Seltzerooper in 1 to 2 weeks.  3. He is to have his INR checked on 10/21 and 10/24 and have those results faxed to Dr. Eston EstersHande'Hurley office.  4.  He is not to have his percutaneous drain pulled by anyone except Dr. Excell Seltzerooper as he has an uncontrolled cystic duct with bile leakage   ____________________________ Si Raiderhristopher A. Cyndie Woodbeck, MD cal:ap D: 03/15/2012 08:38:28 ET T: 03/15/2012 10:08:19 ET JOB#: [id number]  cc: Cristal Deerhristopher A. Laurian Edrington, MD, <Dictator> Jarvis NewcomerHRISTOPHER A Tysen Roesler MD ELECTRONICALLY SIGNED 03/18/2012 10:00

## 2014-09-20 NOTE — Consult Note (Signed)
   Comments   I met with pt's wife. She recognizes that pt is not improving with medical tx and that he may be approaching the end of life. We discussed transfer to the [facility] and wife is in agreement with this. Called pt's daughter to discuss. No answer, message left.  Home liason notified and will see pt.   Electronic Signatures: Rozalyn Osland, Izora Gala (MD)  (Signed [phone] 14:04)  Authored: Palliative Care   Last Updated: 10-Nov-15 14:04 by Husein Guedes, Izora Gala (MD)

## 2014-09-20 NOTE — Consult Note (Signed)
   Comments   After evaluation pt is not appropriate for hospice services.  CM notified and will initiate home health services.  Electronic Signatures: Reather LaurenceMantzouris, Grayling Schranz J (NP)  (Signed [phone]-Nov-15 14:18)  Authored: Palliative Care   Last Updated: 09-Nov-15 14:18 by Reather LaurenceMantzouris, Chrishaun Sasso J (NP)

## 2014-09-20 NOTE — Op Note (Signed)
PATIENT NAME:  Maxwell Hurley, Maxwell Hurley MR#:  [id number] DATE OF BIRTH:  1929/08/03  DATE OF PROCEDURE:  02/06/2014  DATE OF DICTATION: 02/06/2014   PREOPERATIVE DIAGNOSES:  1. Peripheral arterial disease with ulceration and gangrene, right lower extremity.  2. Chronic kidney disease.  3. Atrial fibrillation.  4. Congestive heart failure.  5. Coronary disease.  6. Diabetes.   POSTOPERATIVE DIAGNOSES:  1. Peripheral arterial disease with ulceration and gangrene, right lower extremity.  2. Chronic kidney disease.  3. Atrial fibrillation.  4. Congestive heart failure.  5. Coronary disease.  6. Diabetes.   PROCEDURE: 1. Ultrasound guidance for vascular access to left femoral artery.  2. Catheter placement to right posterior tibial and peroneal arteries from left femoral approach. 3. Aortogram and selective right lower extremity angiogram.  4. Percutaneous transluminal angioplasty of right peroneal artery with 3 mm diameter angioplasty balloon.  5. Percutaneous transluminal angioplasty of right posterior tibial artery and tibioperoneal trunk using a 2.5 mm diameter angioplasty balloon distally in the posterior tibial artery and a 3 mm diameter angioplasty balloon in the mid proximal posterior tibial arteries and tibioperoneal trunk. 6. Percutaneous transluminal angioplasty using a Lutonix 6 mm drug-coated angioplasty balloon to the right popliteal artery.  7. StarClose closure device, left femoral artery.   SURGEON: Annice Needy, MD  ANESTHESIA: Local with moderate conscious sedation.   ESTIMATED BLOOD LOSS: 25 mL.   INDICATION FOR PROCEDURE: This is an 81 year old gentleman with multiple medical issues. He has a heel ulcer with a black necrotic gangrenous eschar. He has a known history of peripheral vascular disease. A couple of years ago, he underwent revascularization of the left lower extremity. He now presents with a nonhealing ulceration in the right lower extremity. He is brought in for  angiography for further evaluation and potential treatment. Risks and benefits were discussed. Informed consent was obtained.   DESCRIPTION OF THE PROCEDURE: The patient was brought to the vascular suite. Groins were shaved and prepped, and a sterile surgical field was created. The left femoral artery was visualized on ultrasound and found to be patent. It was then accessed under direct ultrasound guidance without difficulty with a Seldinger needle, and a permanent image was recorded. A 5 French sheath was then placed. Pigtail catheter was placed in the aorta at the L1-L2 level. AP aortogram was performed. This showed good flow throughout the aorta and iliac segments. No obvious flow derangements to the visceral vessels were seen, although these were not particularly well opacified as on our original aortogram. Given his renal insufficiency and no suspicion of issues in the visceral vessels, I did not move the catheter more cephalad to take further pictures and elected to go ahead and proceed with right lower extremity angiogram. I then crossed the aortic bifurcation with the pigtail catheter and a J-wire and advanced to the right femoral head. Selective right lower extremity angiogram was then performed. This demonstrated no stenosis within the common femoral artery, profunda femoris artery. The superficial femoral artery was calcific, but not stenotic and had no stenoses that were flow-limiting. In the popliteal artery just above the knee was a 60% to 70% stenosis. He then had a normal tibial trifurcation. The anterior tibial artery was chronically occluded and did not appear to reconstitute distally. The posterior tibial artery was chronically occluded; however, it did reconstitute distally at the level of the ankle. The peroneal artery was the dominant runoff initially, but it had about a 70% to 80% stenosis in its mid segment.  The patient was heparinized. A 6 French Ansel sheath was placed over a Terumo  Advantage wire, and a Kumpe catheter and then a CXI catheter were used to traverse through the lower extremity. The popliteal stenosis was crossed without difficulty. I then got down into the tibioperoneal trunk to try to improve his perfusion. I used the CXI wire and Advantage wire and then exchanged for an 0.018 wire to cross the long posterior tibial artery occlusion. This was done with a mild amount of difficulty, but was able to track the CXI catheter over the 0.018 wire and into the foot and confirm intraluminal flow in the posterior tibial artery in the foot. I then replaced the 0.018 wire. The distal posterior tibial artery up to its more proximal segment was treated initially with a long 2.5 mm diameter angioplasty balloon. There was still some narrowing proximally near the entry site of the previous total occlusion and upsized to a 3 mm diameter angioplasty balloon from the mid posterior tibial artery up through the proximal posterior tibial artery and tibioperoneal trunk. Narrowing was seen with inflation, which resolved with angioplasty. A completion angiogram following this showed markedly improved flow with no residual flow-limiting stenosis into the foot in the posterior tibial artery. I then turned my attention to the peroneal artery. Using the 0.018 wire and the CXI catheter, I was able to cross the stenosis and confirm intraluminal flow in the mid to distal peroneal artery. I then replaced the wire. This lesion was treated with a 3 mm diameter angioplasty balloon, with an excellent angiographic completion result and only about 10% residual stenosis. I had now dealt with both tibial lesions. I turned my attention to the popliteal lesion. We exchanged for an 0.035 wire, and a 6 mm diameter x 10 cm length Lutonix drug-coated angioplasty balloon was inflated encompassing the lesion just above the knee in the popliteal artery. A narrowing was seen, which resolved at 12 to 14 atmospheres, and the  inflation was held for 1 minute. The balloon was then deflated. Completion angiogram showed about a 20% residual stenosis which was not flow limiting, and at this point, I elected to terminate the procedure. The sheath was pulled back to the ipsilateral external iliac artery, and oblique arteriogram was performed. StarClose closure device was deployed in the usual fashion with excellent hemostatic result. The patient tolerated the procedure well and was taken to the recovery room in stable condition.   ____________________________ Annice NeedyJason S. Haillee Johann, MD jsd:lb D: 02/06/2014 10:59:53 ET T: 02/06/2014 12:10:10 ET JOB#: [id number]  cc: Annice NeedyJason S. Theron Cumbie, MD, <Dictator> Rhona RaiderMatthew G. Troxler, DPM Barbette ReichmannVishwanath Hande, MD Annice NeedyJASON S Adrian Dinovo MD ELECTRONICALLY SIGNED 02/10/2014 15:06

## 2014-09-20 NOTE — Discharge Summary (Signed)
PATIENT NAME:  Maxwell Hurley, Maxwell Hurley MR#:  [id number] DATE OF BIRTH:  12-21-1929  DATE OF ADMISSION:  04/05/2014 DATE OF DISCHARGE:  04/09/2014  DISCHARGE DIAGNOSES:  1. Shortness of breath secondary to aspiration and interstitial fibrosis.  2. History of atrial fibrillation.  3. Type 2 diabetes.  4. Sacral decubitus.  5. Acute renal insufficiency.   CHIEF COMPLAINT: Shortness of breath.   HISTORY OF PRESENT ILLNESS: [name] is an 81 year old gentleman with a history of type 2 diabetes complicated by neuropathy, history of atrial fibrillation, and CAD who presents to the ED with shortness of breath. The patient had been dropping his oxygen saturation at home and drop to 70% and had improved to with by the time he came to the ED; however, patient did have a persistent cough.   PAST MEDICAL HISTORY: Significant for type 2 diabetes complicated by neuropathy, chronic atrial fibrillation, CAD, peripheral vascular disease, systolic congestive heart failure. Please see H and P for other details.   HOSPITAL COURSE: [facility] and was noted to have elevated troponin. He also had acute renal insufficiency for which he received intravenous fluids; however, he continued to have episodes of shortness of breath and he was seen in consultation by palliative care. He was also placed on oxygen and subsequently transferred to hospice home. The family did not want any further interventions and preferred to keep him comfortable. His breathing had become more labored, and the patient was transferred to hospice home for comfort care.    TOTAL TIME SPENT ON DISCHARGING THIS PATIENT: 35 minutes.     ____________________________ Barbette ReichmannVishwanath Cowen Pesqueira, MD vh:bm D: 09-07-13 18:12:10 ET T: 04/18/2014 04:51:53 ET JOB#: [id number]  cc: Barbette ReichmannVishwanath Rockell Faulks, MD, <Dictator>

## 2014-09-20 NOTE — Discharge Summary (Signed)
PATIENT NAME:  Maxwell Hurley, Maxwell Hurley MR#:  [id number] DATE OF BIRTH:  [birth year]  DATE OF ADMISSION:  06/11/2013 DATE OF DISCHARGE:  06/18/2013  DISCHARGE DIAGNOSES:  1.  Altered mental status secondary to metabolic encephalopathy and delirium.  2.  Chronic left-sided congestive heart failure.  3.  Anxiety and depression.  4.  Diabetes mellitus.  5.  Atrial fibrillation.  6.  Constipation.  7  HTN 8 Sacral pressure sore 9  Generalized weakness.   CHIEF COMPLAINT: Altered mental status and back pain.  HISTORY OF PRESENT ILLNESS: Maxwell Hurley is an 81 year old male with a history of diabetes, hypertension, CKD, CAD, and chronic left-sided systolic congestive heart failure who suffered a fall around Christmas and has been having problems with back pain since then. Films of his back did reveal evidence of L2 compression fracture and had undergone orthopedic evaluation including a bone scan which revealed T11 and T12 increased uptake but no evidence of uptake in L2 suggesting that this is a chronic fracture and plans for kyphoplasty was subsequently canceled, but in the last few days he had been having progressive worsening mental status with waxing and waning confusion associated with visual hallucinations and has also been incontinent of urine. In the ER, patient was noted to have roving eye movements and picking behavior but moving all extremities and was following commands.   PAST MEDICAL HISTORY: Significant for diabetes mellitus, hypertension, CKD, A-fib with chronic Coumadin therapy, coronary artery disease, peripheral vascular disease, chronic left-sided congestive heart failure, glaucoma, history of right hip open reduction in May of 2012, history of left distal great toe amputation, and previous cholecystectomy.   PHYSICAL EXAMINATION: VITAL SIGNS: Temperature was 98, pulse was 95, blood pressure 111/65, O2 sat 85% on room air but picked up to 94% later.  HEENT: NCAT. The patient is edentulous.   HEART: Irregular rhythm.  LUNGS: Clear to auscultation.  ABDOMEN: Soft, nontender.  EXTREMITIES: No edema.   HOSPITAL COURSE: During his stay in the hospital, the patient did have abdominal pain which was felt to be due to constipation and responded well to MiraLax. Initial labs: Glucose 231, BUN 35, creatinine 1.65, sodium 132, alk phos 181, ALT and AST were normal. Total bili was 2.4. WBC count 13.5, hemoglobin 14.5, and platelets 229. TSH 2.13. Urine drug screen was negative. Lipase was 106. INR was 2.1. CK and troponin were unremarkable with troponin of 0.04. Chest x-ray did not reveal any acute cardiopulmonary disease. CT of the head without contrast revealed significant white matter changes without evidence of bleed or mass affect. CT angiogram of the abdomen and pelvis with and without contrast showed small bilateral effusions/atelectasis. No abdominal aortic aneurysm was appreciated. No bowel obstruction was noted. There was new evidence of degenerative disk disease with findings at T11 and T12 and chronic compression fracture of L2. The patient was [facility] to [facility]. His Coumadin was DC'd. He was also started on Seroquel. He was seen in consultation by neurologist, Dr. Manuella Ghazi, who felt this was more likely to be delirium and EEG was performed that showed diffuse mild slowing and there was bihemispheric dysfunction, nonspecific etiology. Could be toxic metabolic or primary neuronal disorder. The patient did have some slight worsening of renal function which responded to IV fluids, and he was seen by physical therapy and ambulated. He was also treated with antibiotics for possible pneumonia and given thiamine as well. His mental status gradually improved. For pain control, he was given Tramadol and he was stable at the  time of discharge.  DISCHARGE MEDICATIONS: Include: 1. Carvedilol 12.5 mg b.i.d.  2.  Ceftin 250 mg p.o. b.i.d. for 5 days.  3.  Folic acid 1 mg a day.  4.  Lisinopril 10 mg a  day. 5.  Multivitamin 1 tablet a day. 6.  Thiamine 100 mg once a day.  7   Glipizide 5 mg po bid  8   Seroquel 25 mg po qhs  9   Tramadol 50 m g po q 6 hrs prn  DISCHARGE INSTRUCTIONS: The patient will be followed up in the clinic, with Dr. Ginette Pitman, in 1 to 2 weeks' time. He was stable at the time of discharge.   TOTAL TIME SPENT IN DISCHARGING THE PATIENT: 35 minutes.  ____________________________ Tracie Harrier, MD vh:sb D: 06/18/2013 13:24:09 ET T: 06/18/2013 14:29:45 ET JOB#: [id number]  cc: Tracie Harrier, MD, <Dictator> Tracie Harrier MD ELECTRONICALLY SIGNED 07/09/2013 13:19

## 2014-09-20 NOTE — H&P (Signed)
PATIENT NAME:  Ernestina PatchesETTY, Hridaan S MR#:  [id number] DATE OF BIRTH:  23-Mar-1930  DATE OF ADMISSION:  04/05/2014  REFERRING PHYSICIAN: Cecille AmsterdamJonathan E. Mayford KnifeWilliams, MD  PRIMARY CARE PHYSICIAN: Barbette ReichmannVishwanath Hande, [facility].   CHIEF COMPLAINT: Shortness of breath.   HISTORY OF PRESENT ILLNESS: An 81 year old Caucasian male with a history of hypertension, type 2 diabetes non-insulin-requiring complicated by neuropathy, atrial fibrillation, and coronary artery disease, presenting with shortness of breath. The patient and family are amazingly poor historians; however, history obtained through daughter, granddaughter, and patient. Apparently he has been short of breath for 2 to 3 days' duration which has been continuous with associated cough, nonproductive. No fevers, chills, or chest pain. No further symptomatology. According to daughter at bedside, they checked his saturations at home. He desaturated to the 70s; however, improved upon arrival to the Emergency Department. Of note, he was recently discharged from hospice care. The family requested he actually go back on hospice. He has worsened since he has been off of their care. Currently the patient is denying any complaints; however, he is coughing actively.  REVIEW OF SYSTEMS: Question the validity of these statements. However:  CONSTITUTIONAL: Positive for fatigue, weakness. Denies fevers, chills.  EYES: Denies blurred vision, double vision, eye pain.  EARS, NOSE, THROAT: Denies tinnitus, ear pain, hearing loss.  RESPIRATORY: Positive for cough as well as shortness of breath. CARDIOVASCULAR: Denies chest pain, palpitations, edema.  GASTROINTESTINAL: Denies nausea, vomiting, diarrhea, abdominal pain.  GENITOURINARY: Denies dysuria, hematuria.  ENDOCRINE: Denies nocturia or thyroid problems. HEMATOLOGY AND LYMPHATIC: Denies easy bruising and bleeding.  SKIN: Denies rashes or lesions.  MUSCULOSKELETAL: Denies pain in neck, back, shoulder, knees, hips,  or arthritic symptoms.  NEUROLOGIC: Denies paralysis, paresthesias.  PSYCHIATRIC: Denies anxiety or depressive symptoms.  Otherwise, full review of systems performed by me is negative.   PAST MEDICAL HISTORY: Type 2 diabetes non-insulin-requiring complicated by neuropathy, peripheral hypertension, chronic atrial fibrillation, coronary artery disease, peripheral vascular disease, systolic congestive heart failure, unknown ejection fraction.   SOCIAL HISTORY: No alcohol, tobacco, or drug use. Uses a walker for ambulation.   FAMILY HISTORY: Positive for coronary artery disease as well as hypertension.   ALLERGIES: SULFA DRUGS.   HOME MEDICATIONS: Include acetaminophen, hydrocodone 325/10 mg p.o. q. 6 hours as needed for pain, nitroglycerin 0.4 mg sublingual every 5 minutes as needed for chest pain, Reglan 10 mg p.o. 4 times daily before meals and at bedtime, promethazine 25 mg p.o. q. 6 hours as needed for nausea and vomiting, quetiapine 50 mg p.o. at bedtime as needed for agitation, mupirocin topical cream 2 % to affected area 3 times daily, Lasix 40 mg p.o. q. daily, Peri-Colace 50/8.6 mg 1 to 2 tabs every 2 hours until bowel movement, potassium 10 mEq p.o. q. daily, pantoprazole 40 mg p.o. q. daily.   PHYSICAL EXAMINATION:  VITAL SIGNS: Temperature 97.8, heart rate 83, respirations 18, blood pressure 121/80, saturating 100% on 2 L nasal cannula. Weight 81.6 kg, BMI 23.8.  GENERAL: Chronically ill-appearing Caucasian gentleman currently in no acute distress.  HEAD: Normocephalic, atraumatic.  EYES: Pupils equal, round, reactive to light. Extraocular muscles intact. No scleral icterus.  MOUTH: Dry mucosal membranes. Dentition poor. No abscess noted. EARS, NOSE, THROAT: Clear without exudates. No external lesions.  NECK: Supple. No thyromegaly. No nodules. No JVD.  PULMONARY: Coarse breath sounds bilaterally; however, no frank wheezes, rales, or rhonchi. No use of accessory muscles. Good  respiratory effort.  CHEST: Nontender to palpation.  CARDIOVASCULAR: S1,  S2, irregular rate, irregular rhythm. No murmurs, rubs, or gallops. No edema. Pedal pulses 2+ bilaterally. GASTROINTESTINAL: Soft, nontender, nondistended. No masses. Positive bowel sounds. No hepatosplenomegaly.  MUSCULOSKELETAL: No swelling, clubbing, or edema. Range of motion full in all extremities.  NEUROLOGIC: Cranial nerves II through XII intact. No gross focal neurological deficits. Sensation intact. Reflexes intact.  SKIN: On his right heel there is a pressure wound present on admission which is currently under dressings. He also has 2 likely stage 1 sacral decubitus ulcers present on admission. Otherwise, no further lesions, rashes, cyanosis. Skin warm, dry. Turgor intact.  PSYCHIATRIC: Mood and affect within normal limits. Patient awake, alert, oriented x 3. Insight and judgment are intact.   LABORATORY DATA: EKG performed reveals atrial fibrillation, rate in the 80s. Chest x-ray performed reveals fine interstitial pattern not changed from prior and consistent with chronic lung disease. Remainder of laboratory data: Sodium 134, potassium 4.5, chloride 101, bicarbonate 24, BUN 41, creatinine 1.98, glucose of 321. LFTs: Albumin of 3.2, otherwise within normal limits. Troponin of 1.6. WBC 10.9, hemoglobin 11.9, platelets of 201,000.   ASSESSMENT AND PLAN: An 81 year old gentleman with a history of hypertension, type 2 diabetes not insulin requiring complicated by neuropathy, as well as history of atrial fibrillation and coronary artery disease presenting with shortness of breath.  1.  Acute kidney injury, likely prerenal given poor p.o. intake. Gentle intravenous fluid hydration. Follow urine output and renal function.  2.  Elevated troponin. Initiate aspirin and statin therapy. Trend cardiac enzymes x 3. Place on telemetry. We will check an INR as family is unsure if he is actually on warfarin or not. If cardiac enzymes  continue to trend upwards, will start heparin drip at that time.  3.  Shortness of breath, unclear etiology, question if this is related to aspiration. However, there is no pneumonitis or pneumonia on chest x-ray. Discussed the possibility of aspiration and discussed about speech evaluation. The family actually declined this at this time.  4.  Atrial fibrillation, currently rate controlled. Will check an INR, place on telemetry.  5.  Type 2 diabetes. Hold p.o. medications. Start insulin sliding scale. 6.  Sacral ulcer present on admission. Will consult wound care for further evaluation and management.  7.  Venous thromboembolism prophylaxis with heparin subcutaneous.  CODE STATUS: The patient is DNR.   TIME SPENT: 45 minutes.    ____________________________ Cletis Athens. Rylon Poitra, MD dkh:ST D: 04/05/2014 21:26:19 ET T: 04/05/2014 22:29:55 ET JOB#: [id number]  cc: Cletis Athens. Monnica Saltsman, MD, <Dictator> Taya Ashbaugh Synetta Shadow MD ELECTRONICALLY SIGNED 04/06/2014 2:15

## 2014-09-20 NOTE — Discharge Summary (Signed)
PATIENT NAME:  Maxwell Hurley, Drake S MR#:  [id number] DATE OF BIRTH:  1930/03/12  DATE OF ADMISSION:  04/05/2014 DATE OF DISCHARGE:  04/09/2014  DISCHARGE DIAGNOSES:  1. Shortness of breath secondary to aspiration and interstitial fibrosis.  2. History of atrial fibrillation.  3. Type 2 diabetes.  4. Sacral decubitus.  5. Acute renal insufficiency.   CHIEF COMPLAINT: Shortness of breath.   HISTORY OF PRESENT ILLNESS: Brown HumanRobert Porco is an 81 year old gentleman with a history of type 2 diabetes complicated by neuropathy, history of atrial fibrillation, and CAD who presents to the ED with shortness of breath. The patient had been dropping his oxygen saturation at home and drop to 70% and had improved to with by the time he came to the ED; however, patient did have a persistent cough.   PAST MEDICAL HISTORY: Significant for type 2 diabetes complicated by neuropathy, chronic atrial fibrillation, CAD, peripheral vascular disease, systolic congestive heart failure. Please see H and P for other details.   HOSPITAL COURSE: The patient [facility] and was noted to have elevated troponin. He also had acute renal insufficiency for which he received intravenous fluids; however, he continued to have episodes of shortness of breath and he was seen in consultation by palliative care. He was also placed on oxygen and subsequently transferred to hospice home. The family did not want any further interventions and preferred to keep him comfortable. His breathing had become more labored, and the patient was transferred to hospice home for comfort care.    TOTAL TIME SPENT ON DISCHARGING THIS PATIENT: 35 minutes.    ____________________________ Barbette ReichmannVishwanath Athen Riel, MD vh:bm D: December 28, 2013 18:12:00 ET T: 04/18/2014 04:51:53 ET JOB#: [id number] Barbette ReichmannVISHWANATH Jaquavian Firkus MD ELECTRONICALLY SIGNED 04/29/2014 17:47

## 2014-09-20 NOTE — Consult Note (Signed)
   Comments   Goals of Care conversation held at bedside with pt's wife.  Wife states that she has noticed a decline in pt over the last month.  Wife has stated it has been difficult to care for pt.  wife states that pt is mainly bedbound and states sometimes this is by his own choice.  Pt is not SOB with ambulation and was previously on home O2.  Wife states pt appetite has decreased and needs help with feedings.  Pt needs help with bathing and dressing himself.   Family states pt is having coughing episodes with swallowing and will follow ST notes.Wife would like some help in the home and appreciates palliative care following. at this point if pt qualifies for Hospice.    Health vs. Hospice  Electronic Signatures: Reather LaurenceMantzouris, Arelys Glassco J (NP)  (Signed [phone]-Nov-15 12:02)  Authored: Palliative Care   Last Updated: 09-Nov-15 12:02 by Reather LaurenceMantzouris, Jonni Oelkers J (NP)

## 2014-09-20 NOTE — H&P (Signed)
PATIENT NAME:  Maxwell Hurley, Maxwell Hurley MR#:  [id number] DATE OF BIRTH:  04-Sep-1929  DATE OF ADMISSION:  06/11/2013  CHIEF COMPLAINT:   Altered mental status and back pain.   HISTORY OF PRESENT ILLNESS: An 81 year old male with history of diabetes, hypertension, chronic kidney disease stage III, coronary artery disease and chronic left-sided systolic congestive heart failure, suffered a fall somewhere around Christmas. He has been having trouble with back pain since that time. Films revealed evidence of L2 compression fracture. He had undergone evaluation by orthopedics and underwent bone scan, which revealed T11 and T12 increased uptake, but no evidence of uptake in L2, suggesting this was a chronic fracture. Report is that they had anticipated having going for kyphoplasty; however, the plan was not to pursue with this apparently after the bone scan findings. Then there was a report that over  the last several days he has had progressive worsening of his mental status with waxing and waning confusion. He has had been having some visual hallucinations. He has been picking at things at times. He has had worsening pain and generalized weakness, now to the point that he is longer able to get out of the bed without significant assistance. He has been incontinent of urine at times, although it is unclear whether or not he simply physically cannot get to the restroom or whether or not the urine is coming out without him being aware. The patient is able to communicate currently, complains of back pain, but denies any other complaints. On examination in the Emergency Room, he has got eyes roving right and left and occasional picking behaviors, but otherwise moves all extremities and follows commands. Labs in the Emergency Room were remarkable only for mild increase of his creatinine from his baseline, although he was noted to have elevation of his bilirubin now and his alkaline phosphatase, the remaining transaminases stable. He  is admitted now with altered mental status, what appears to be metabolic encephalopathy and delirium, etiology unclear.   PAST MEDICAL HISTORY: 1.  Diabetes mellitus.  2.  Hypertension.  3.  Chronic kidney disease stage III.  4.  Atrial fibrillation on chronic Coumadin therapy.  5.  Coronary artery disease.  6.  Peripheral vascular disease.  7.  Chronic left-sided systolic congestive heart failure.  8.  Glaucoma.  9.  History of right hip open reduction and internal fixation, May 2012.  10.  History of left distal great toe amputation, 2012.  11.  Status post cholecystectomy.   ALLERGIES: SULFA.   MEDICATIONS: 1.  Vitamin D 1000 units p.o. daily.  2.  Glipizide 5 mg p.o. b.i.d.  3.  Coreg 12.5 mg p.o. b.i.d.  4.  Lasix 40 mg p.o. daily.  5.  Lovastatin 20 mg p.o. daily.  6.  Zoloft 75 mg p.o. daily.  7.  Pantoprazole 40 mg p.o. daily.  8.  Coumadin 5 mg daily alternating with 6 mg on Wednesday, Saturday and Sunday.  9.  Lisinopril 10 mg p.o. daily.  91.  Percocet, which family members have held over the last 48 hours without change in mental status.   SOCIAL HISTORY: No alcohol or tobacco reported.   FAMILY HISTORY: Coronary artery disease, hypertension and stroke.   REVIEW OF SYSTEMS: Please see HPI. Has felt chills, but no fevers documented. Not eating much, so they have tried to get him to drink liquids. No nausea, vomiting is reported. They have seen no blood per rectum or melena. His urine has seemed dark at times.  The remainder of complete review of systems is limited by the patient's confusion.   PHYSICAL EXAMINATION: VITAL SIGNS: Temperature 98, pulse 95, blood pressure 111/65. Initial saturation reported 85% on room air, now 94% on room air.  GENERAL: Elderly male, alert, but confused, in no acute distress.  EYES: Pupils are round and reactive to light. Extraocular motion is intact.  EAR, NOSE, THROAT:  External examination unremarkable. The patient is edentulous and  oropharynx is dry without lesions.  NECK: Significant increased tension is noted along the top of his neck, with the patient's family  state is chronic for the patient. Trachea in the midline.  CARDIOVASCULAR: Irregularly irregular without gallops, rubs. Carotid and radial pulses appear 2 +.  LUNGS: Clear bilaterally without wheeze or retraction.  ABDOMEN: Soft, quiet bowel sounds. Mild tenderness to deep palpation in the left lower quadrant without guard, rebound or mass.  SKIN: Decreased skin turgor. Some bruising is noted over the shins.  LYMPH NODES: No cervical or supraclavicular nodes.  MUSCULOSKELETAL: No clubbing, cyanosis or significant edema is appreciated. There are postop changes in the left foot and on the right hip.  NEUROLOGIC: Cranial nerves appear to be grossly intact. Again, with the patient sitting quietly, his eyes roving left to right and with some picking behavior in his hands; however, he focuses on the examiner and follows commands. Grip appears to be equal bilaterally. Moving both extremities with some discomfort with moving the right leg limiting his motor strength. No pronator drift is appreciated. Mild dysmetria on finger-to-nose.   LABORATORY, DIAGNOSTIC AND RADIOLOGICAL DATA: Glucose 231 with BUN 35, creatinine 1.65, sodium 132. Alk phos 181 with ALT and AST normal. Total bilirubin 2.4. Ethanol level 0.003. White count 13.5 with hemoglobin 14.5 and platelets 229. TSH 2.13. Urine drug screen was negative. Lipase 106. INR 2.1. Urinalysis with 100 mg/dL of protein, 50 mg of glucose, otherwise unremarkable with some hyaline casts. CK and troponin are unremarkable with a troponin of 0.04. Chest x-ray reveals no acute cardiopulmonary disease. CT of the head without contrast reveals significant white matter changes without evidence of bleed or mass effect. CT angiogram of the abdomen and pelvis with and without contrast revealed small bilateral effusions with atelectasis, possibility  of infiltrate. No abdominal aortic aneurysm is appreciated. No bowel obstruction. Large amount of stoWill hold ol was noted in the colon. Degenerative disk disease with most severe findings at T11 and T12 noted with chronic compression fracture in  L2.   IMPRESSION AND PLAN: 1.  Altered mental status. Appears to be consistent with metabolic encephalopathy/delirium, with etiology unclear. Given the altered mental status, elevated white count, question of hypoxia and possibility of atelectasis versus infiltrate, we will cover him empirically with antibiotics, but he does not have any other clinical symptoms on examination of pneumonia, and this may simply be poor inflation. We will add hydration. He certainly has not slept well and certainly sleep deprivation may be driving some of this, but would like neurology evaluation. We will try to get EEG in the a.m. Consideration for an MRI of the brain, but currently he cannot hold still to perform this. We may consider trying Seroquel at nighttime if he shows any more evidence of hallucination or confusion, with consideration for Haldol as well, but we will try to hold anything else sedating.  2.  Chronic left-sided systolic congestive heart failure. We will check BNP, hold Coreg for now given relatively low blood pressure, continue lisinopril though, and will need to watch  his renal function.  3.  Anxiety/depression. Had been sertraline, not clear that he has been taking this. We will hold this for now. No fevers to be more suspicious for neuroleptic malignant syndrome, although his mental status changes are concerning. Again, they feel that his neck stiffness is not different for the patient, but would like to get neurology opinion on this.  4.  Diabetes mellitus. Cover with sliding scale insulin and hold medication for now.  5.  Atrial fibrillation. We will hold Coumadin given his multiple falls. Place him on Lovenox for now.   The above plan of action was  discussed with the patient and with family members.     ____________________________ Adin Hector, MD bjk:dmm D: 06/11/2013 20:57:20 ET T: 06/11/2013 21:24:37 ET JOB#: [id number]  cc: Adin Hector, MD, <Dictator> Ramonita Lab MD ELECTRONICALLY SIGNED 06/12/2013 7:33

## 2014-09-20 NOTE — Consult Note (Signed)
Referring Physician:  Tracie Harrier :   Primary Care Physician:  [facility], [address], [place], [place] [address], [phone]  Reason for Consult: Admit Date: 11-Jun-2013  Chief Complaint: deliruim  Reason for Consult: altered mental status   History of Present Illness: History of Present Illness:   History obtained from chart review (no family available at bedside)81 year old Hurley with history of diabetes, hypertension, chronic kidney disease stage III, coronary artery disease and chronic left-sided systolic congestive heart failure, suffered a fall somewhere around Christmas.  has been having trouble with back pain since that time. Films revealed evidence of L2 compression fracture, but had neg bone scan so did not get kyphoplasty.  the last several days he has had progressive worsening of his mental status with waxing and waning confusion. He has had been having some visual hallucinations. He has been picking at things at times. He has had worsening pain and generalized weakness MEDICAL HISTORY: Diabetes mellitus.  Hypertension.  Chronic kidney disease stage III.  Atrial fibrillation on chronic Coumadin therapy.  Coronary artery disease.  Peripheral vascular disease.  Chronic left-sided systolic congestive heart failure.  Glaucoma.   Surgical: Growth removed from left foot. Left eye cataract surgery.    Rt Femoral neck fracture- S/p ORIF - May 2012(Dr. Rudene Christians)  Amputation of left Halluxat 1st meta tarso phalyngeal level- Nov 2012- Dr. Elvina Mattes  Choecystectomy -2013   SULFA.  HISTORY: No alcohol or tobacco reported.  HISTORY: artery disease,    ROS:  Review of Systems   not obtainable due to MS  Past Medical/Surgical Hx:  CHF:   Cancer, Skin:   Multi-drug Resistant Organism (MDRO): Positive culture for MRSA.  Multi-drug Resistant Organism (MDRO): Positive culture for MRSA.  Multi-drug Resistant Organism (MDRO): Positive  culture for MRSA.  Multi-drug Resistant Organism (MDRO): Positive culture for MRSA.  CAD:   Cardiomegaly:   Diabetes:   Hypercholesterolemia:   htn:   Cardiac Cath:   Home Medications: Medication Instructions Last Modified Date/Time  Colace sodium 100 mg capsule 1 cap(s) orally 2 times a day 13-Jan-15 17:10  warfarin 5 mg oral tablet 1 tab(s) orally once a day (in the evening) except on Saturday and Sunday 13-Jan-15 17:10  Lasix 40 mg oral tablet 1 tab(s) orally once a day, As Needed for swelling 13-Jan-15 17:10  Zoloft 100 mg oral tablet 1 tab(s) orally once a day 13-Jan-15 17:10  acetaminophen-oxyCODONE 325 mg-5 mg tablet 1-2 tab(s) orally every 4 to 6 hours, As Needed - for Pain 13-Jan-15 17:10  warfarin 6 mg oral tablet 1 tab(s) orally once a day (in the evening) on Saturday and Sunday 13-Jan-15 17:10  carvedilol 12.5 mg oral tablet 1 tab(s) orally 2 times a day  13-Jan-15 17:10  lovastatin 20 mg oral tablet 1 tab(s) orally once a day (at bedtime)  13-Jan-15 17:10  glipiZIDE 5 mg oral tablet 1 tab(s) orally 2 times a day 13-Jan-15 17:10  lisinopril 10 mg oral tablet 1 tab(s) orally once a day 13-Jan-15 17:10  pantoprazole 40 mg oral delayed release tablet 1 tab(s) orally once a day 13-Jan-15 17:10   Allergies:  Sulfa drugs: Swelling  Vital Signs: **Vital Signs.:   14-Jan-15 10:04  Vital Signs Type Routine  Temperature Temperature (F) 98.6  Celsius 37  Temperature Source oral  Pulse Pulse 76  Respirations Respirations 20  Systolic BP Systolic BP 573  Pulse Ox % Pulse Ox % 97  Pulse Ox Activity Level  At rest  Oxygen Delivery 2L    14:38  Vital Signs Type Routine  Temperature Temperature (F) 98.5  Celsius 36.9  Temperature Source oral  Pulse Pulse 101  Respirations Respirations 18  Systolic BP Systolic BP 917  Diastolic BP (mmHg) Diastolic BP (mmHg) 83  Mean BP 97  Pulse Ox % Pulse Ox % 90  Pulse Ox Activity Level  At rest  Oxygen Delivery 2L   EXAM: General  Exam Patient looks appropriate of age, well built, nourished and appropriately groomed, keeps mouth open.   Cardiovascular Exam: S1, S2 heart sounds present Carotid exam revealed no bruit Lung exam was clear to auscultation belly tender to touch, same as painful spontaneous movement of right LE.  Neurological Exam      Mental Status:      Alert, not Oriented (Jan 1984, don't know where is he, who is the present), very hypophonic to almost mute, barely follows one step commands,  poor  Attention span, no neurological neglect can't check other higher cortical function.        Cranial Nerves:      Olfactory and vagus nerves not are examined      Pupils were equal, round and reactive, can't check visual fields reliablly,      Extra-ocular movements - sccadic pursuit      Facial sensations are normal      Face is symmetric decreased hearing bil      Palate and uvular movements are normal and oral sensations are OK      Neck muscle strength and shoulder shrug is normal      Tongue protrusion and uvular elevation are normal       Motor Exam:      Tone is normal in all extremities      Muscle strength difficult to check but picked up both arms ok, very severe pain with lifting up right leg and can lift up left leg. asterixis - mild in both UE       Deep Tendon Reflexes:      symmetric 1 +      toes mute            Sensory Exam:      Sensations were intact to light touch in all extremities             Co-ordination:      can't check reliablly due to weakness and asterixis            Gait:      can't check due to weakness and poor mental status.  Lab Results:  Thyroid:  13-Jan-15 22:29   Thyroid Stimulating Hormone 2.64 (0.45-4.50 (International Unit)  ----------------------- Pregnant patients have  different reference  ranges for TSH:  - - - - - - - - - -  Pregnant, first trimetser:  0.36 - 2.50 uIU/mL)  Hepatic:  14-Jan-15 03:53   Bilirubin, Total  2.0  Bilirubin,  Direct  1.3 (Result(s) reported on 12 Jun 2013 at 04:34AM.)  Alkaline Phosphatase  165 (45-117 NOTE: New Reference Range 04/19/13)  SGPT (ALT) 23  SGOT (AST) 26  Total Protein, Serum 6.6  Albumin, Serum  2.7  Routine Micro:  13-Jan-15 22:35   Micro Text Report BLOOD CULTURE   COMMENT                   NO GROWTH IN 8-12 HOURS   ANTIBIOTIC  Culture Comment NO GROWTH IN 8-12 HOURS  Result(s) reported on 12 Jun 2013 at [facility].  Routine Chem:  13-Jan-15 15:16   Ethanol, S. 3  Ethanol % (comp) 0.003 (Result(s) reported on 11 Jun 2013 at 03:59PM.)  Lipase 106 (Result(s) reported on 11 Jun 2013 at 03:45PM.)  14-Jan-15 03:53   Glucose, Serum  187  BUN  31  Creatinine (comp)  1.36  Sodium, Serum  130  Potassium, Serum 3.7  Chloride, Serum 98  CO2, Serum 23  Calcium (Total), Serum 8.5  Anion Gap 9  Osmolality (calc) 272  eGFR (African American)  56  eGFR (Non-African American)  48 (eGFR values <3m/min/1.73 m2 may be an indication of chronic kidney disease (CKD). Calculated eGFR is useful in patients with stable renal function. The eGFR calculation will not be reliable in acutely ill patients when serum creatinine is changing rapidly. It is not useful in  patients on dialysis. The eGFR calculation may not be applicable to patients at the low and high extremes of body sizes, pregnant women, and vegetarians.)  Urine Drugs:  [id number]  Tricyclic Antidepressant, Ur Qual (comp) NEGATIVE (Result(s) reported on 11 Jun 2013 at 04:14PM.)  Amphetamines, Urine Qual. NEGATIVE  MDMA, Urine Qual. NEGATIVE  Cocaine Metabolite, Urine Qual. NEGATIVE  Opiate, Urine qual NEGATIVE  Phencyclidine, Urine Qual. NEGATIVE  Cannabinoid, Urine Qual. NEGATIVE  Barbiturates, Urine Qual. NEGATIVE  Benzodiazepine, Urine Qual. NEGATIVE (----------------- The URINE DRUG SCREEN provides only a preliminary, unconfirmed analytical test result and should not be used for  non-medical  purposes.  Clinical consideration and professional judgment should be  applied to any positive drug screen result due to possible interfering substances.  A more specific alternate chemical method must be used in order to obtain a confirmed analytical result.  Gas chromatography/mass spectrometry (GC/MS) is the preferred confirmatory method.)  Methadone, Urine Qual. NEGATIVE  Cardiac:  13-Jan-15 15:16   CK, Total 132 (Result(s) reported on 11 Jun 2013 at 04:10PM.)  14-Jan-15 03:53   Troponin I 0.04 (0.00-0.05 0.05 ng/mL or less: NEGATIVE  Repeat testing in 3-6 hrs  if clinically indicated. >0.05 ng/mL: POTENTIAL  MYOCARDIAL INJURY. Repeat  testing in 3-6 hrs if  clinically indicated. NOTE: An increase or decrease  of 30% or more on serial  testing suggests a  clinically important change)  Routine UA:  13-Jan-15 15:16   Color (UA) Amber  Clarity (UA) Hazy  Glucose (UA) 50 mg/dL  Bilirubin (UA) Negative  Ketones (UA) Negative  Specific Gravity (UA) 1.018  Blood (UA) 1+  pH (UA) 5.0  Protein (UA) 100 mg/dL  Nitrite (UA) Negative  Leukocyte Esterase (UA) Negative (Result(s) reported on 11 Jun 2013 at 04:10PM.)  RBC (UA) 2 /HPF  WBC (UA) 1 /HPF  Bacteria (UA) TRACE  Epithelial Cells (UA) 1 /HPF  Mucous (UA) PRESENT  Hyaline Cast (UA) 6 /LPF (Result(s) reported on 11 Jun 2013 at 04:10PM.)  Routine Coag:  14-Jan-15 03:53   Prothrombin  23.5  INR 2.2 (INR reference interval applies to patients on anticoagulant therapy. A single INR therapeutic range for coumarins is not optimal for all indications; however, the suggested range for most indications is 2.0 - 3.0. Exceptions to the INR Reference Range may include: Prosthetic heart valves, acute myocardial infarction, prevention of myocardial infarction, and combinations of aspirin and anticoagulant. The need for a higher or lower target INR must be assessed individually. Reference: The Pharmacology and  Management of the Vitamin K  antagonists: the seventh ACCP  Conference on Antithrombotic and Thrombolytic Therapy. QZRAQ.7622 Sept:126 (3suppl): [id number]. A HCT value >55% may artifactually increase the PT.  In one study,  the increase was an average of 25%. Reference:  "Effect on Routine and Special Coagulation Testing Values of Citrate Anticoagulant Adjustment in Patients with High HCT Values." American Journal of Clinical Pathology 2006;126:400-405.)  Routine Hem:  13-Jan-15 22:29   Erythrocyte Sed Rate 11 (Result(s) reported on 11 Jun 2013 at 11:14PM.)  14-Jan-15 03:53   WBC (CBC)  13.7  RBC (CBC) 5.10  Hemoglobin (CBC) 14.1  Hematocrit (CBC) 42.6  Platelet Count (CBC) 206  MCV 84  MCH 27.8  MCHC 33.2  RDW  16.4  Neutrophil % 83.7  Lymphocyte % 6.6  Monocyte % 9.1  Eosinophil % 0.2  Basophil % 0.4  Neutrophil #  11.5  Lymphocyte #  0.9  Monocyte #  1.3  Eosinophil # 0.0  Basophil # 0.1 (Result(s) reported on 12 Jun 2013 at 04:27AM.)   Radiology Results: CT:    13-Jan-15 17:45, CT Head Without Contrast  CT Head Without Contrast   REASON FOR EXAM:    multiple falls yesterday. AMS today  COMMENTS:       PROCEDURE: CT  - CT HEAD WITHOUT CONTRAST  - Jun 11 2013  5:45PM     CLINICAL DATA:  pt to ed with c/o back pain x several days, and per  caregiver also concerned about confusion and ? hallucinations, as pt  has been seen grabbing at things in the air.    EXAM:  CT HEAD WITHOUT CONTRAST    TECHNIQUE:  Contiguous axial images were obtained from the base of the skull  through the vertex without intravenous contrast.  COMPARISON:  CT HEAD W/O CM dated 05/29/2013    FINDINGS:  There is no evidence of mass effect, midline shift, or extra-axial  fluid collections. There is no evidence of a space-occupying lesion  or intracranial hemorrhage. There is no evidence of a cortical-based  area of acute infarction. There is generalized cerebral atrophy.  There is  periventricular white matter low attenuation likely  secondary to microangiopathy. There are old left external capsule  lacunar infarcts.    The ventricles and sulci are appropriate for the patient's age.  Incidental note is made of a cavum septum pellucidum vergae. The  basal cisterns are patent.  Visualized portions of the orbits are unremarkable. The visualized  portions of the paranasal sinuses and mastoid air cells are  unremarkable. Cerebrovascular atherosclerotic calcifications are  noted.    The osseous structures are unremarkable.     IMPRESSION:  No acute intracranial pathology.      Electronically Signed    By: Kathreen Devoid    On: 06/11/2013 17:59     Verified By: Jennette Banker, M.D., MD   Radiology Impression: Radiology Impression: age appropriate cortical atrophy, 2 left putamenal lacunar infracts, other WM microvascular ischemic changes.   Impression/Recommendations: Recommendations:   1) Metabolic encephalopathy:patient's encephalopathy, seem to have multifactorial etiology.Toxic-metabolic derangement are common cause of acute encephalopathy in vulnerable population. this pt had recent fall - which could have caused concussion (elderly pt with reduced cerebral volume are very sensitive to even minor falls related atypical post concussion symptoms).he also has hypoxia on his ABG.asterixis on exam points towards metabolic etiology of encephalopathy.pain and pain medications can lead to encephalopathy. reviewed admission labs.Patient doesn't seem to have sensory aphasia (Wernicke's aphasia), neglect syndrome, cortical blindness. Lesion in silent areas of brain can be  difficult to appreciate on neurological exam.Consider MRI brain to look for lesion. medication overdose, withdrawal and improper use can lead to acute encephalopathy.Please avoid anticholinergics, older generation anti-psychotics, anti-histaminics etc. give multivitamin, minerals, THIAMINE 100 mg,Sleep  deprivation and circadian rhythm abnormalities in vulnerable population can cause confusional state.  phamacological measures/instructions for nursing staff:Provide frequent orientation/reorientation (date, day, month on notice board)Room should be well lit during day time and turn off lights at night time.Avoid unnecessary interruption to patient's sleep.Turn off TV, if patient is not watching and specially at night time.Avoid noisy environment to facilitate sleep/rest.Remove Foley catheter/NG tube as soon as possible.  lacunar stroke and WM disease - h/o a.fib on anticoag with coumadincan't rule out underlying cognitive disorder (e.g. dementia) at baselinewill follow  Electronic Signatures: Ray Church (MD)  (Signed 09-Feb-15 18:36)  Authored: REFERRING PHYSICIAN, Primary Care Physician, Consult, History of Present Illness, Review of Systems, PAST MEDICAL/SURGICAL HISTORY, HOME MEDICATIONS, ALLERGIES, NURSING VITAL SIGNS, Physical Exam-, LAB RESULTS, RADIOLOGY RESULTS, Recommendations   Last Updated: 09-Feb-15 18:36 by Ray Church (MD)

## 2014-09-21 NOTE — Op Note (Signed)
PATIENT NAME:  Maxwell Hurley, Maxwell Hurley MR#:  [id number] DATE OF BIRTH:  23-Sep-1929  DATE OF PROCEDURE:  12/02/2011  PREOPERATIVE DIAGNOSIS: Osteomyelitis distal phalanx second toe left foot.   POSTOPERATIVE DIAGNOSIS: Osteomyelitis distal phalanx second toe left foot.   PROCEDURE: Amputation DIPJ level second toe left foot.    SURGEON: Rhona RaiderMatthew G. Jakhi Dishman, DPM  ASSISTANT: None.   HISTORY OF PRESENT ILLNESS: Patient had his left great toe amputated six to eight months ago secondary to osteomyelitis. Developed an ulcer on tip of his second toe which developed MRSA infection. Bone was exposed in this region and cultured and was positive for MRSA. It has been responsive to doxycycline.   ANESTHESIA: Local with monitored anesthesia care.   ANESTHESIOLOGIST: Dr. Chipper OmanKephart  ESTIMATED BLOOD LOSS: Negligible.   HEMOSTASIS: None.   DESCRIPTION OF PROCEDURE: Patient brought to the Operating Room, placed on the Operating Room table in the supine position. At this point after local anesthesia was achieved, patient was prepped and draped in usual sterile manner. Attention was then directed to the distal second toe left foot where an ulcer was noted. It was stable. There was no purulence from the region. An incision made across the top of the toe behind the nail area. This was fishmouthed along and both sides, extended distally plantar and distal portion of second toe. Once this was complete the incision was deepened dorsally. The extensor tendon was released over the DIP joint and the distal phalanx was dissected away from soft tissue and the distal tip of the toe was removed. The area was then copiously irrigated and after copious irrigation, the plantar flap was sutured proximally with 5-0 nylon simple interrupted sutures. This was done medially and laterally as well. Good apposition of soft tissue was noted. Clean tissues were noted within. The area was then dressed with a wet to dry saline gauze, sterile dressings  consisting of 4 x 4'Hurley, Conform, Kerlix and an Ace wrap. Patient tolerated procedure and anesthesia well. Blood loss was estimated approximately 10 mL. Patient left the Operating Room for the recovery room with vital signs stable and neurovascular status intact.   ____________________________ Rhona RaiderMatthew G. Fountain Derusha, DPM mgt:cms D: 12/02/2011 09:48:45 ET T: 12/02/2011 11:29:00 ET JOB#: [id number]  cc: Rhona RaiderMatthew G. Marrio Scribner, DPM, <Dictator> Epimenio SarinMATTHEW G Edythe Riches MD ELECTRONICALLY SIGNED 12/16/2011 12:11

## 2014-11-21 IMAGING — CR DG CHEST 2V
1 series · 3 of 3 positions shown · non-contrast
Comparison: Radiograph 06/11/2013

CLINICAL DATA: Short of breath for 3 days

EXAM:
CHEST  2 VIEW

[Series 1: dxr chest pa (or ap) and lateral · 0.14mm/px · 3 of 3 slices shown]
[im 1/3]
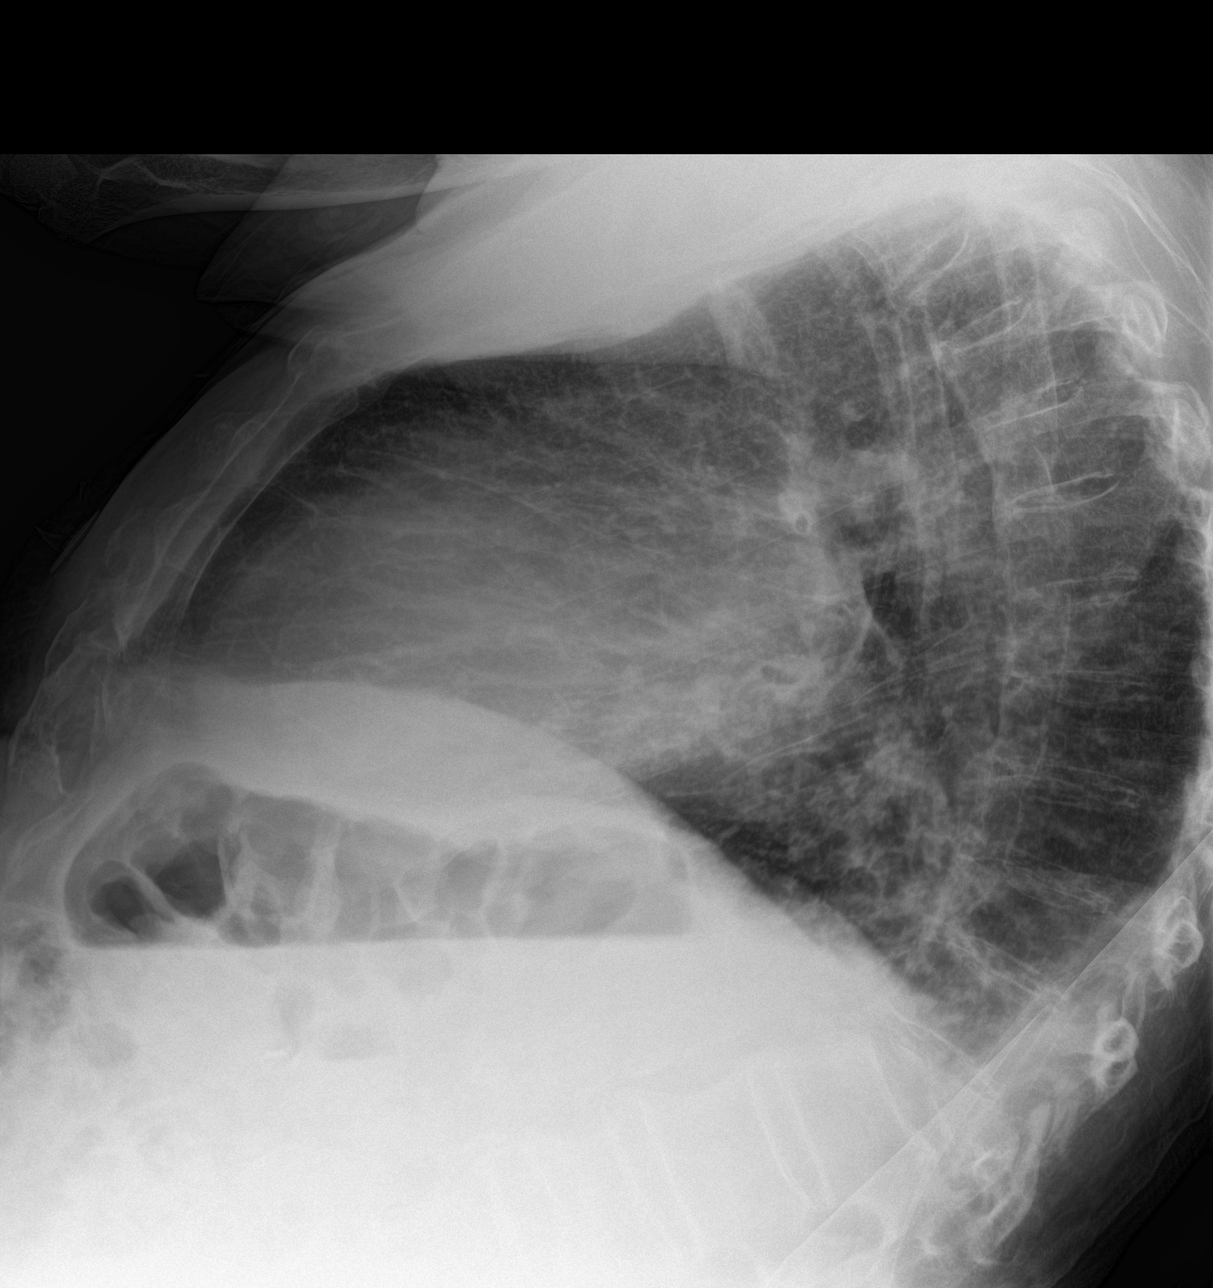
[im 2/3]
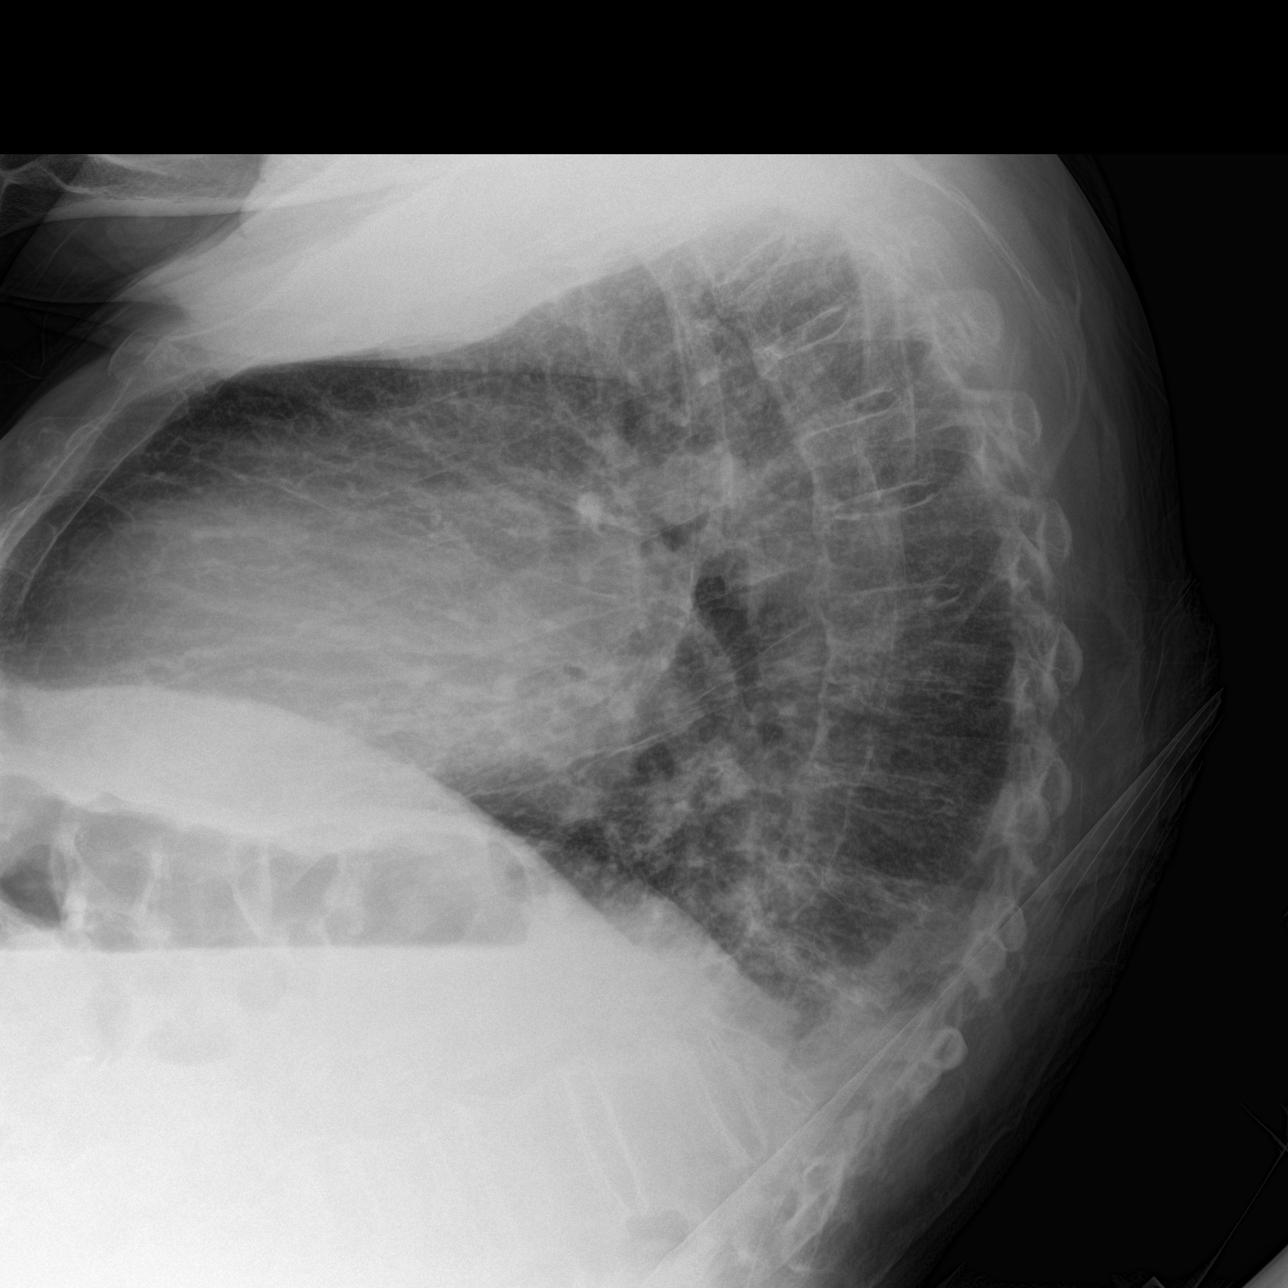
[im 3/3]
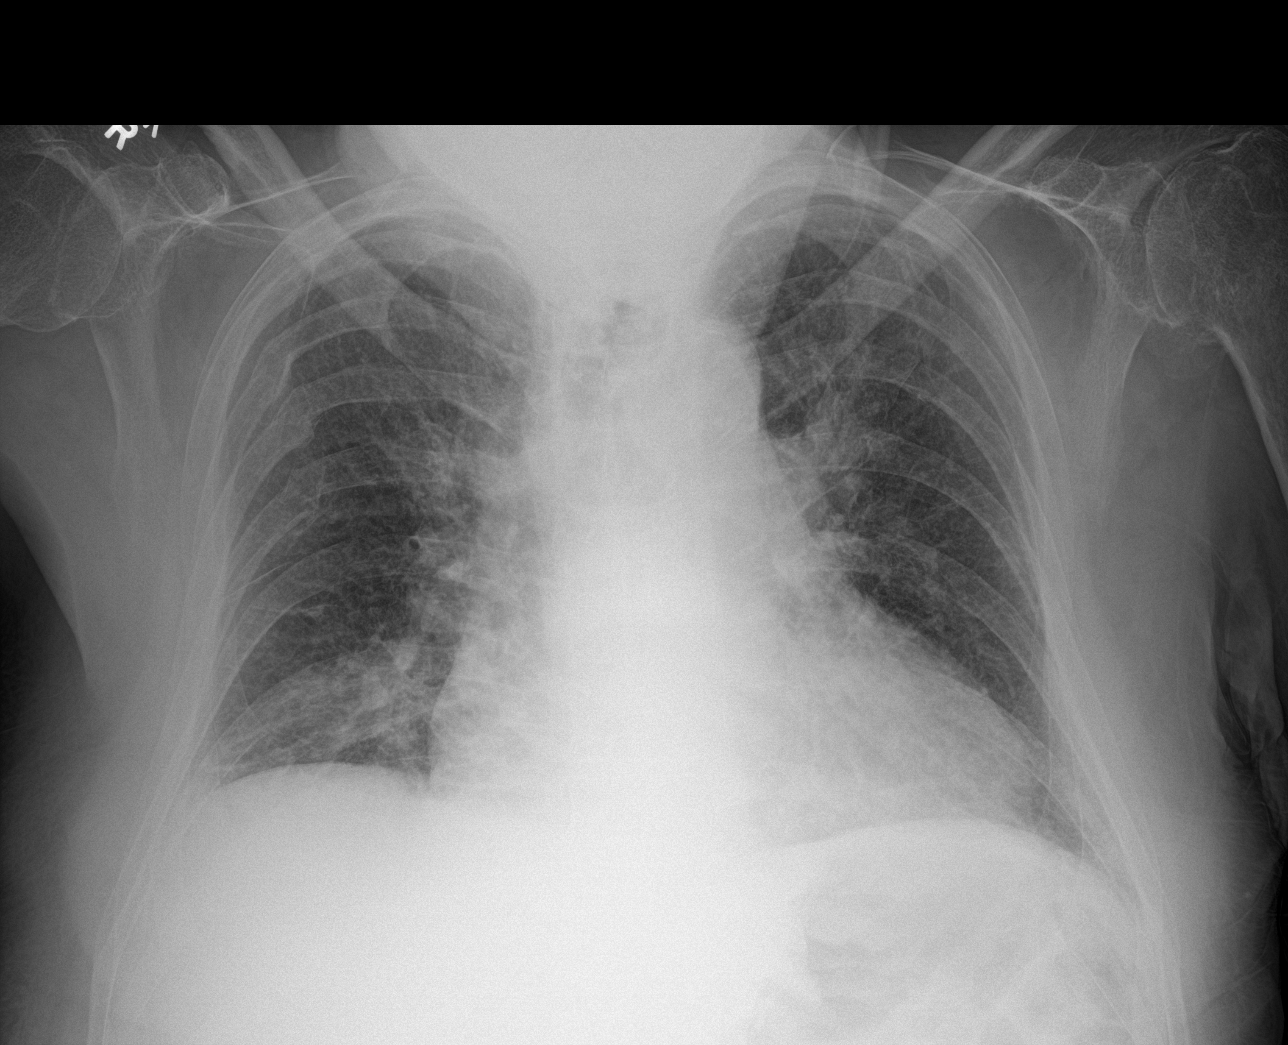

[3 of 3 positions shown; findings below may reference images not displayed]

FINDINGS: Stable enlarged cardiac silhouette. There is a fine interstitial
pattern which is not changed from prior. Remote posterior right rib
fractures noted. Lateral projection demonstrates small effusions.
There is a compression fracture the lower thoracic spine with focal
kyphosis. This is progressed from radiographs of 05/29/2013.
IMPRESSION: 1. Fine interstitial pattern not changed from prior consistent with
chronic interstitial lung disease.
2. Progression of compression fractures in the lower thoracic spine
with focal kyphosis.
3. Small effusion.

## 2014-11-24 IMAGING — CR DG CHEST 1V PORT
1 series · 2 of 2 positions shown · non-contrast
Comparison: PA and lateral chest x-ray April 05, 2014

CLINICAL DATA: Shortness of breath and cough.  ; kyphosis

EXAM:
PORTABLE CHEST - 1 VIEW

[Series 1: ap · 0.17mm/px · 2 of 2 slices shown]
[im 1/2]
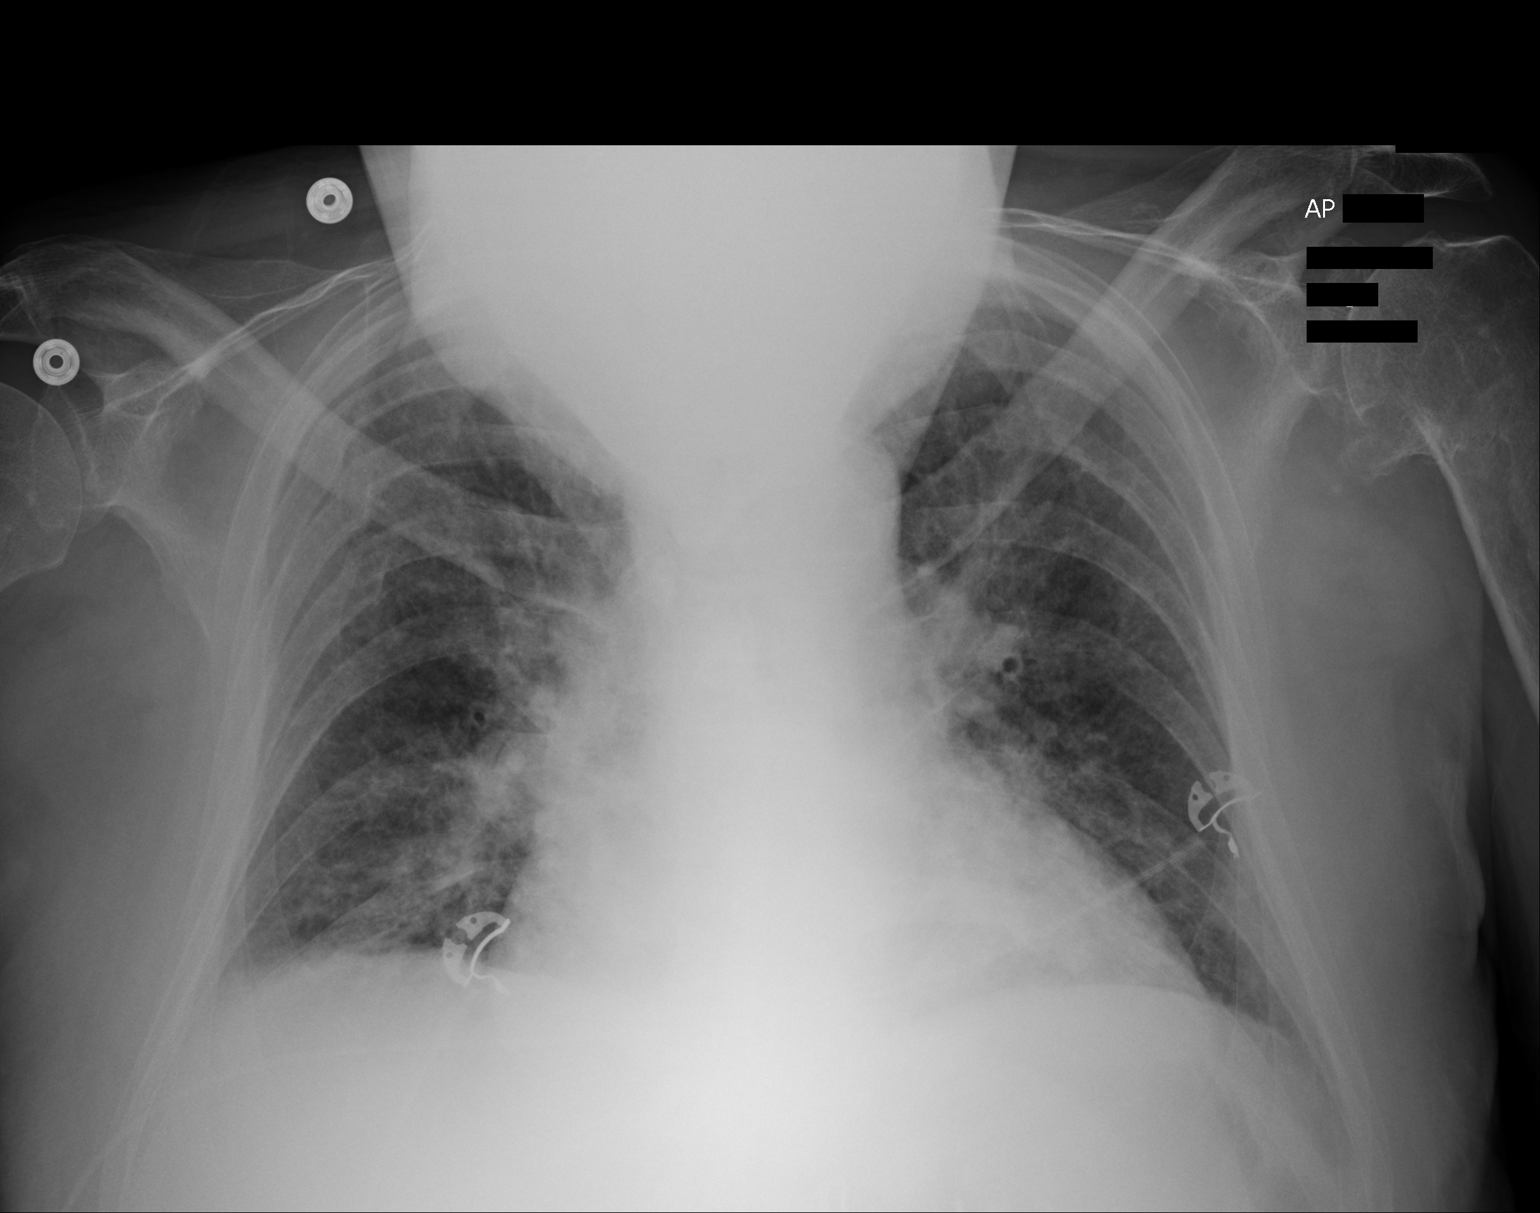
[im 2/2]
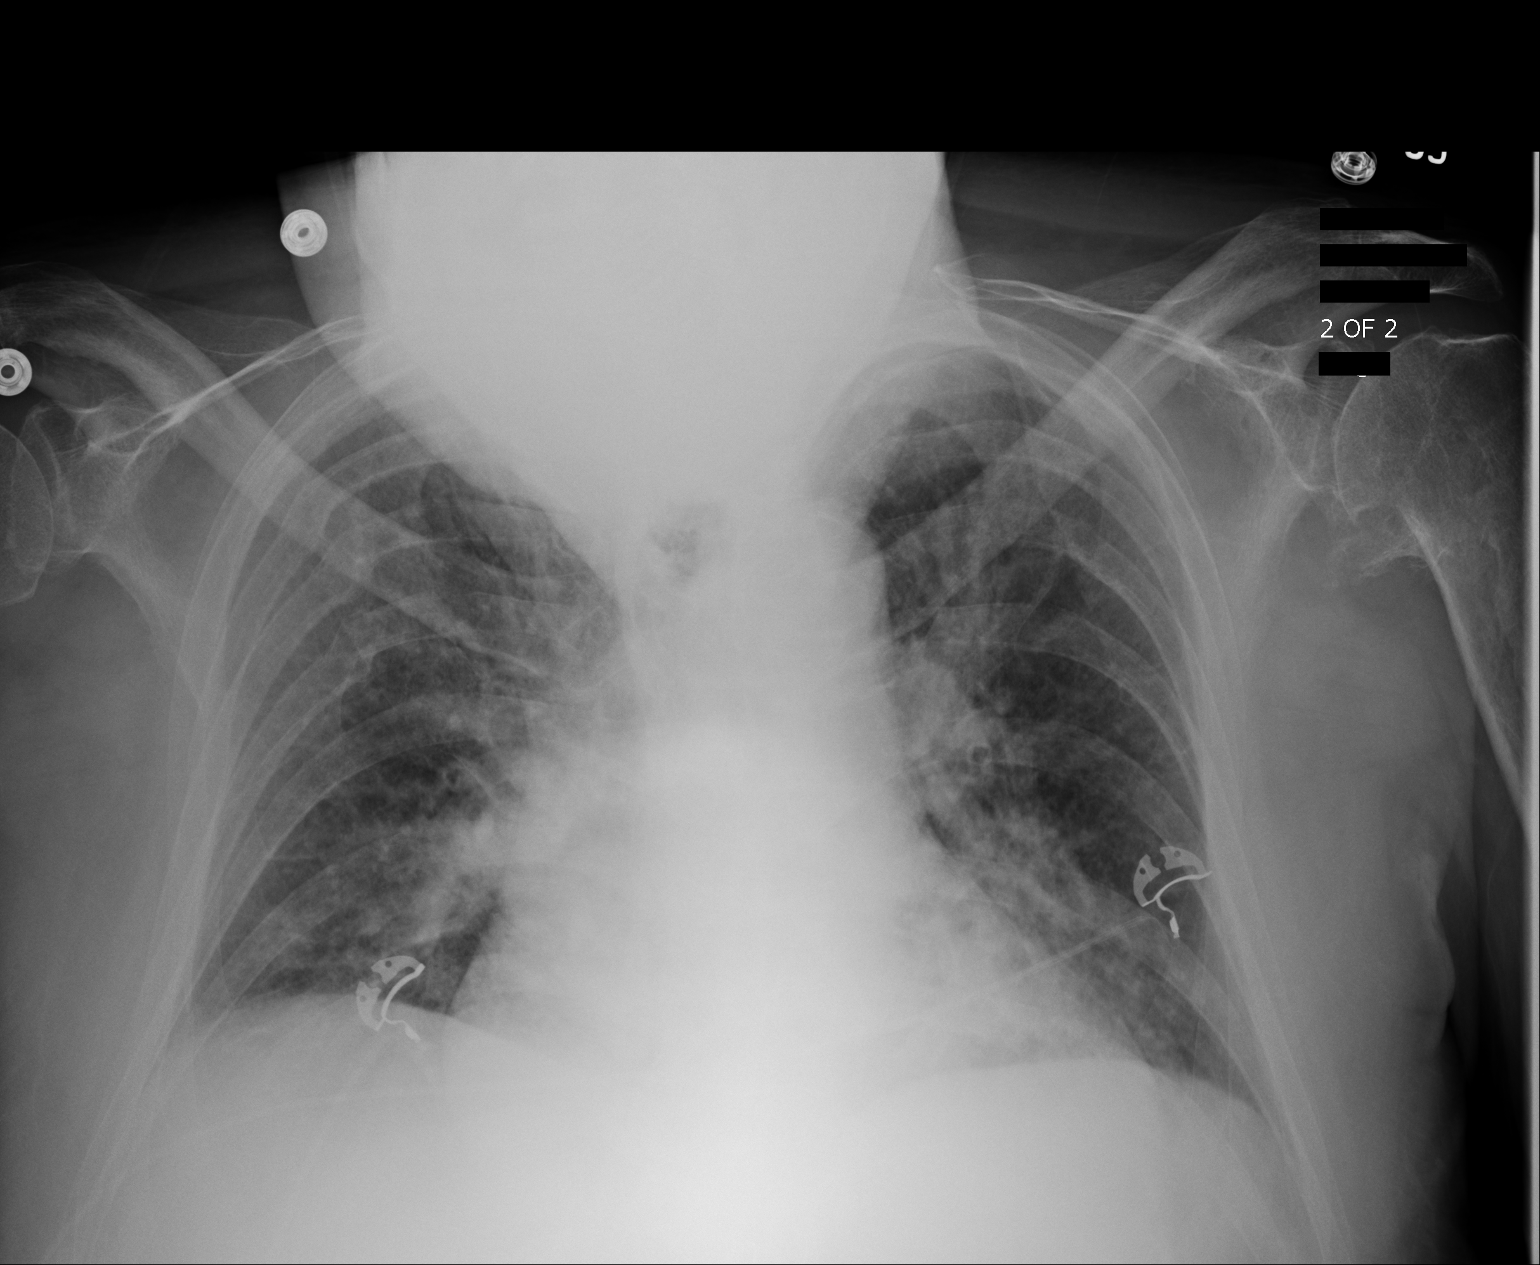

[2 of 2 positions shown; findings below may reference images not displayed]

FINDINGS: The lungs are adequately inflated. The interstitial markings are
mildly increased and are more conspicuous than on the earlier study.
The cardiopericardial silhouette is enlarged. The pulmonary
vascularity is indistinct. There old deformities of the fourth
through sixth ribs on the right. There are degenerative changes of
the left shoulder.
IMPRESSION: Slight worsening of pulmonary interstitial edema likely of cardiac
cause. No alveolar pneumonia is demonstrated.
